# Patient Record
Sex: Female | Born: 1935 | Race: Black or African American | Hispanic: No | Smoking: Never smoker
Health system: Southern US, Community
[De-identification: ages and names within clinical notes are randomized; demographics above are authoritative.]

## PROBLEM LIST (undated history)

## (undated) DIAGNOSIS — M1990 Unspecified osteoarthritis, unspecified site: Secondary | ICD-10-CM

## (undated) DIAGNOSIS — H269 Unspecified cataract: Secondary | ICD-10-CM

## (undated) DIAGNOSIS — E119 Type 2 diabetes mellitus without complications: Secondary | ICD-10-CM

## (undated) HISTORY — DX: Unspecified osteoarthritis, unspecified site: M19.90

## (undated) HISTORY — DX: Type 2 diabetes mellitus without complications: E11.9

## (undated) HISTORY — DX: Unspecified cataract: H26.9

---

## 1998-11-22 ENCOUNTER — Emergency Department (HOSPITAL_COMMUNITY): Admission: EM | Admit: 1998-11-22 | Discharge: 1998-11-22 | Payer: Self-pay | Admitting: Emergency Medicine

## 1999-09-23 ENCOUNTER — Encounter: Admission: RE | Admit: 1999-09-23 | Discharge: 1999-09-23 | Payer: Self-pay | Admitting: Cardiology

## 1999-09-23 ENCOUNTER — Encounter: Payer: Self-pay | Admitting: Cardiology

## 2000-10-02 ENCOUNTER — Encounter: Admission: RE | Admit: 2000-10-02 | Discharge: 2000-10-02 | Payer: Self-pay | Admitting: Cardiology

## 2000-10-02 ENCOUNTER — Encounter: Payer: Self-pay | Admitting: Cardiology

## 2001-10-28 ENCOUNTER — Encounter: Payer: Self-pay | Admitting: Cardiology

## 2001-10-28 ENCOUNTER — Encounter: Admission: RE | Admit: 2001-10-28 | Discharge: 2001-10-28 | Payer: Self-pay | Admitting: Cardiology

## 2002-11-28 ENCOUNTER — Encounter: Payer: Self-pay | Admitting: Cardiology

## 2002-11-28 ENCOUNTER — Encounter: Admission: RE | Admit: 2002-11-28 | Discharge: 2002-11-28 | Payer: Self-pay | Admitting: Cardiology

## 2003-12-13 ENCOUNTER — Encounter: Admission: RE | Admit: 2003-12-13 | Discharge: 2003-12-13 | Payer: Self-pay | Admitting: Cardiology

## 2004-08-28 ENCOUNTER — Encounter: Admission: RE | Admit: 2004-08-28 | Discharge: 2004-08-28 | Payer: Self-pay | Admitting: Cardiology

## 2005-02-28 ENCOUNTER — Encounter: Admission: RE | Admit: 2005-02-28 | Discharge: 2005-02-28 | Payer: Self-pay | Admitting: Cardiology

## 2006-03-05 ENCOUNTER — Encounter: Admission: RE | Admit: 2006-03-05 | Discharge: 2006-03-05 | Payer: Self-pay | Admitting: Cardiology

## 2006-09-30 ENCOUNTER — Encounter: Admission: RE | Admit: 2006-09-30 | Discharge: 2006-12-29 | Payer: Self-pay | Admitting: Cardiology

## 2007-05-04 ENCOUNTER — Encounter: Admission: RE | Admit: 2007-05-04 | Discharge: 2007-05-04 | Payer: Self-pay | Admitting: Cardiology

## 2007-11-11 ENCOUNTER — Ambulatory Visit (HOSPITAL_COMMUNITY): Admission: RE | Admit: 2007-11-11 | Discharge: 2007-11-11 | Payer: Self-pay | Admitting: *Deleted

## 2007-11-11 ENCOUNTER — Ambulatory Visit: Payer: Self-pay | Admitting: Surgery

## 2007-11-11 ENCOUNTER — Encounter (INDEPENDENT_AMBULATORY_CARE_PROVIDER_SITE_OTHER): Payer: Self-pay | Admitting: Cardiology

## 2007-11-24 ENCOUNTER — Encounter: Admission: RE | Admit: 2007-11-24 | Discharge: 2007-11-24 | Payer: Self-pay | Admitting: Cardiology

## 2007-11-24 IMAGING — CR DG TIBIA/FIBULA 2V*R*
4 series · 4 of 4 positions shown · non-contrast
Comparison: None

CLINICAL DATA: Pain in the distal leg.

RIGHT TIBIA AND FIBULA - 2 VIEW

[t tib/fib ap right (1 of 2)]
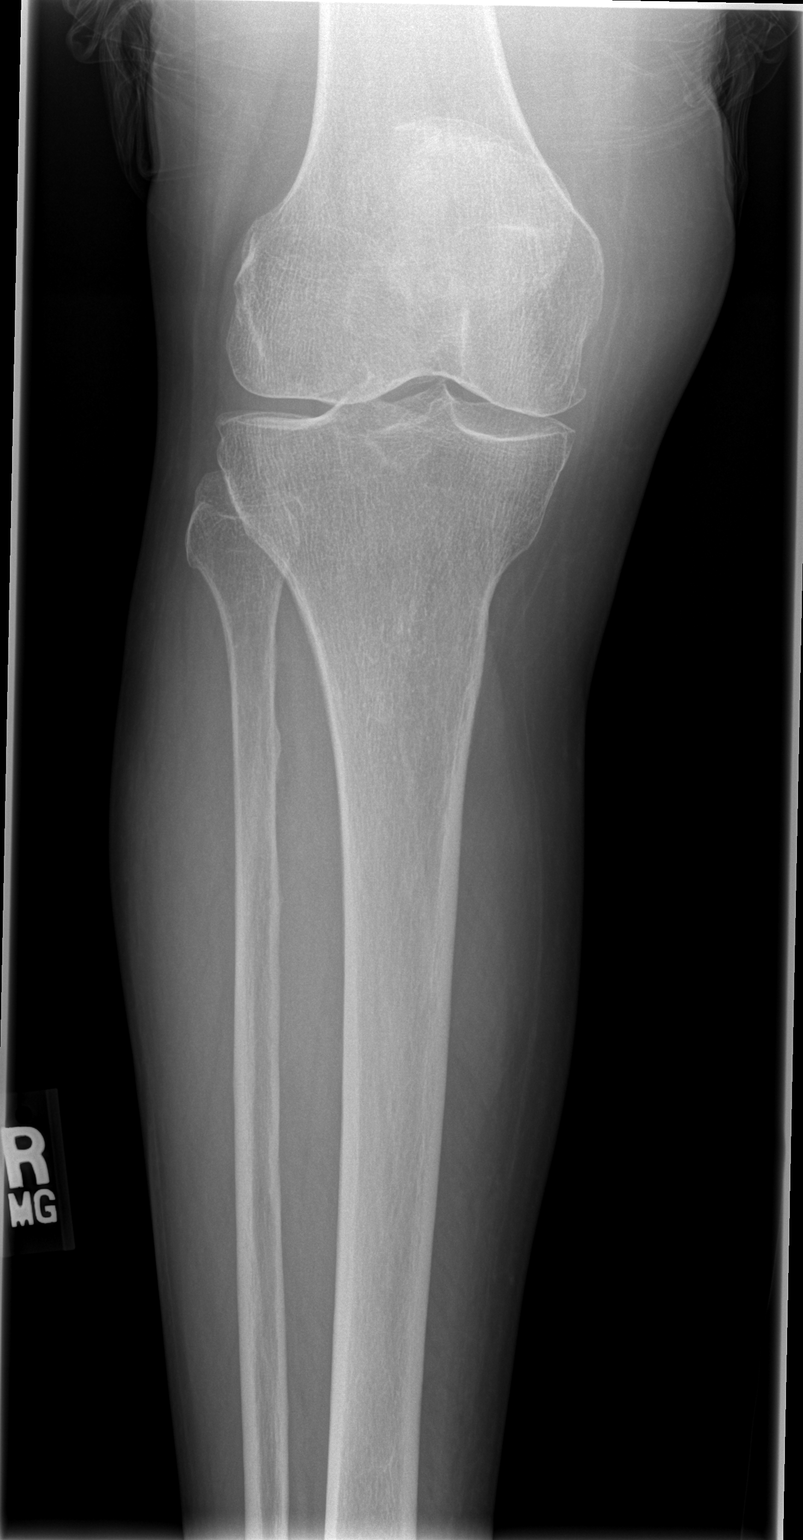

[t tib/fib ap right (2 of 2)]
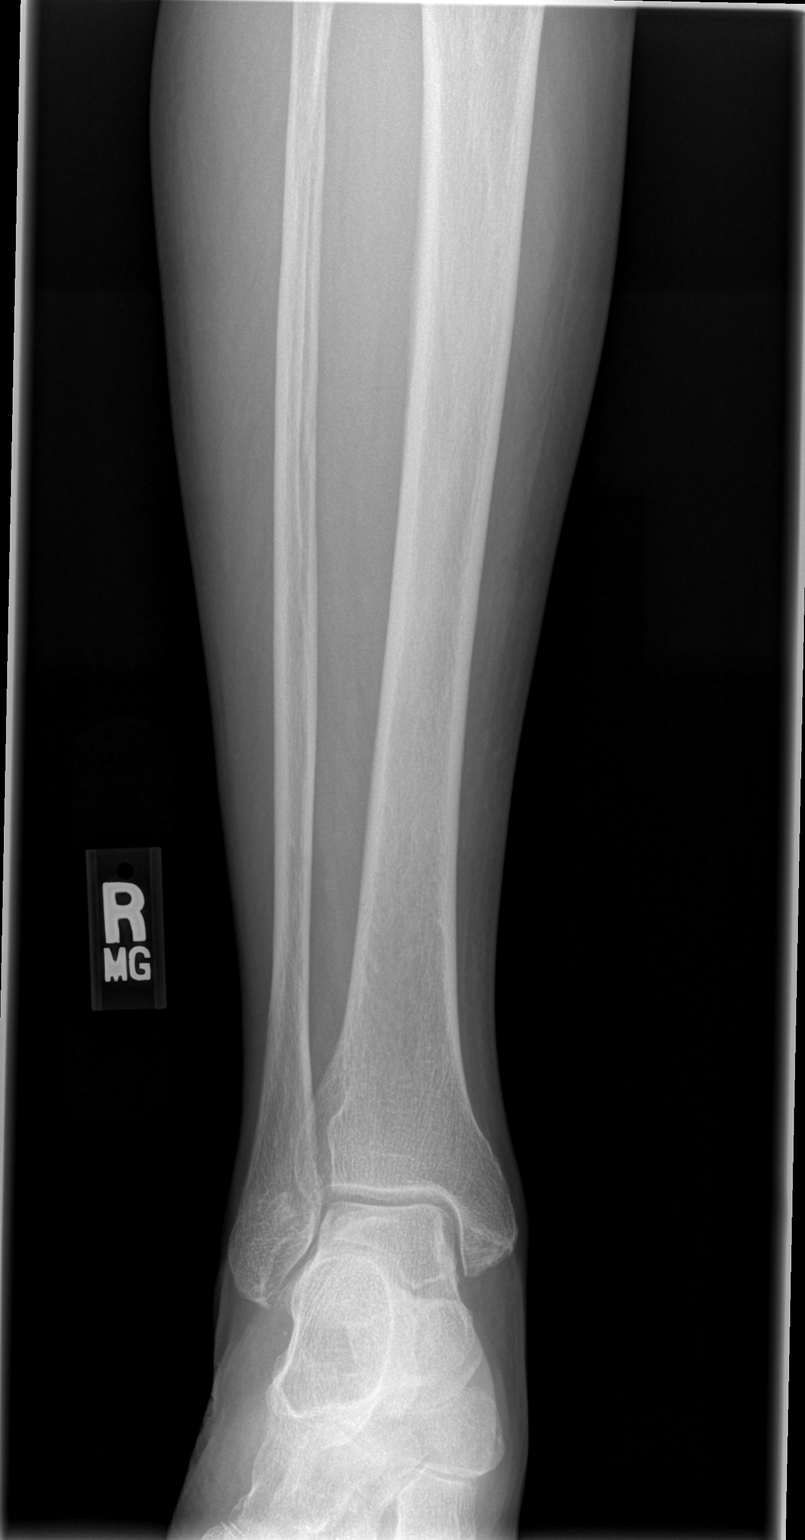

[t tib/fib lat right (1 of 2)]
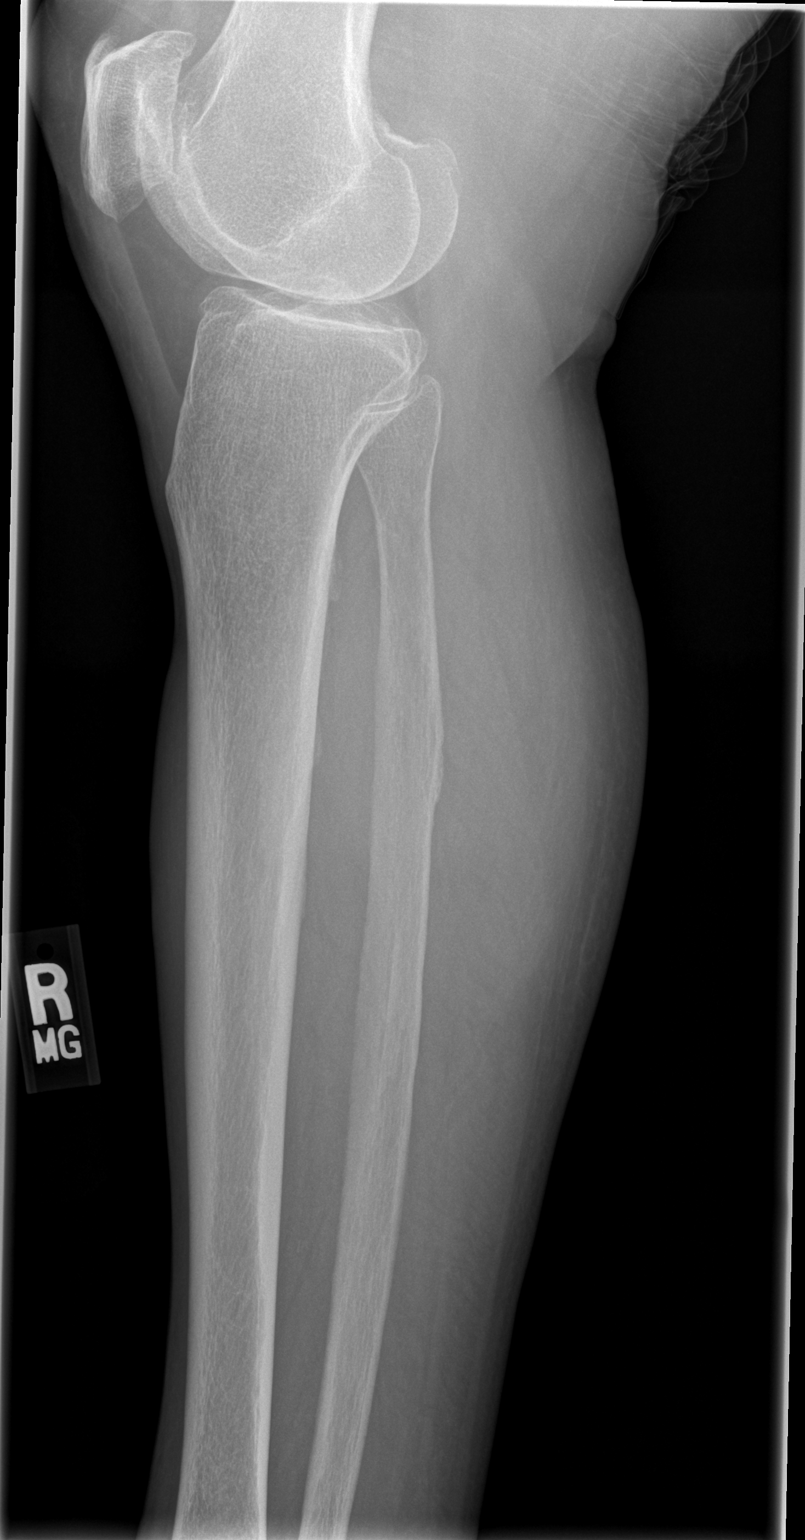

[t tib/fib lat right (2 of 2)]
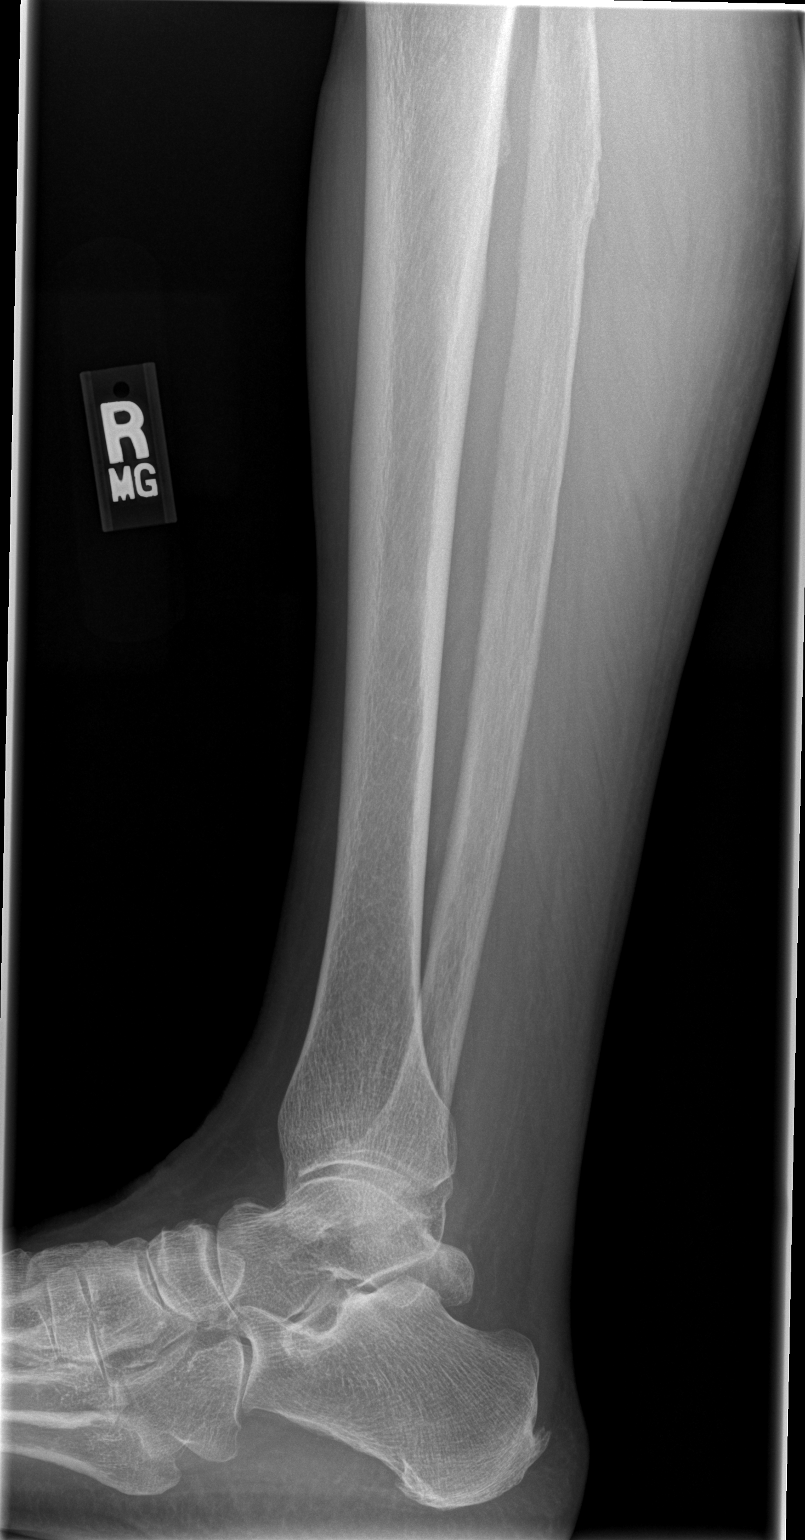

[4 of 4 positions shown; findings below may reference images not displayed]

FINDINGS: Two-view exam of the right tibia and fibula shows no
evidence for an acute fracture.  No worrisome lytic or sclerotic
osseous lesion.  Degenerative changes are seen in the knee.  The
patient has a very prominent posterior process of the talus, which
has been associated with posterior impingement.
IMPRESSION: No acute bony abnormality in the tibia or fibula.

## 2008-05-09 ENCOUNTER — Encounter: Admission: RE | Admit: 2008-05-09 | Discharge: 2008-05-09 | Payer: Self-pay | Admitting: Cardiology

## 2009-06-14 ENCOUNTER — Encounter: Admission: RE | Admit: 2009-06-14 | Discharge: 2009-06-14 | Payer: Self-pay | Admitting: Cardiology

## 2009-06-21 ENCOUNTER — Encounter: Admission: RE | Admit: 2009-06-21 | Discharge: 2009-06-21 | Payer: Self-pay | Admitting: Orthopedic Surgery

## 2010-07-02 ENCOUNTER — Other Ambulatory Visit: Payer: Self-pay | Admitting: Cardiology

## 2010-07-02 DIAGNOSIS — Z1231 Encounter for screening mammogram for malignant neoplasm of breast: Secondary | ICD-10-CM

## 2010-07-18 ENCOUNTER — Ambulatory Visit
Admission: RE | Admit: 2010-07-18 | Discharge: 2010-07-18 | Disposition: A | Discharge status: Home / Self Care | Payer: Medicare Other | Source: Ambulatory Visit | Attending: Cardiology | Admitting: Cardiology

## 2010-07-18 DIAGNOSIS — Z1231 Encounter for screening mammogram for malignant neoplasm of breast: Secondary | ICD-10-CM

## 2011-12-01 ENCOUNTER — Other Ambulatory Visit: Payer: Self-pay | Admitting: Cardiology

## 2011-12-01 DIAGNOSIS — Z1231 Encounter for screening mammogram for malignant neoplasm of breast: Secondary | ICD-10-CM

## 2011-12-10 ENCOUNTER — Other Ambulatory Visit: Payer: Self-pay | Admitting: Cardiology

## 2011-12-10 DIAGNOSIS — M259 Joint disorder, unspecified: Secondary | ICD-10-CM

## 2011-12-18 ENCOUNTER — Ambulatory Visit: Payer: Medicare Other

## 2012-01-05 ENCOUNTER — Other Ambulatory Visit: Payer: Medicare Other

## 2012-01-15 ENCOUNTER — Ambulatory Visit
Admission: RE | Admit: 2012-01-15 | Discharge: 2012-01-15 | Disposition: A | Discharge status: Home / Self Care | Payer: Medicare Other | Source: Ambulatory Visit | Attending: Cardiology | Admitting: Cardiology

## 2012-01-15 DIAGNOSIS — Z1231 Encounter for screening mammogram for malignant neoplasm of breast: Secondary | ICD-10-CM

## 2012-01-22 ENCOUNTER — Other Ambulatory Visit: Payer: Medicare Other

## 2012-02-05 ENCOUNTER — Ambulatory Visit
Admission: RE | Admit: 2012-02-05 | Discharge: 2012-02-05 | Disposition: A | Discharge status: Home / Self Care | Payer: Medicare Other | Source: Ambulatory Visit | Attending: Cardiology | Admitting: Cardiology

## 2012-02-05 DIAGNOSIS — M259 Joint disorder, unspecified: Secondary | ICD-10-CM

## 2014-10-24 ENCOUNTER — Other Ambulatory Visit: Payer: Self-pay | Admitting: Internal Medicine

## 2014-10-24 DIAGNOSIS — Z1231 Encounter for screening mammogram for malignant neoplasm of breast: Secondary | ICD-10-CM

## 2014-10-24 DIAGNOSIS — M8580 Other specified disorders of bone density and structure, unspecified site: Secondary | ICD-10-CM

## 2014-12-20 ENCOUNTER — Ambulatory Visit: Payer: Self-pay

## 2014-12-20 ENCOUNTER — Other Ambulatory Visit: Payer: Self-pay

## 2015-01-11 ENCOUNTER — Ambulatory Visit
Admission: RE | Admit: 2015-01-11 | Discharge: 2015-01-11 | Disposition: A | Discharge status: Home / Self Care | Payer: Medicare Other | Source: Ambulatory Visit | Attending: Internal Medicine | Admitting: Internal Medicine

## 2015-01-11 ENCOUNTER — Other Ambulatory Visit: Payer: Self-pay

## 2015-01-11 DIAGNOSIS — Z1231 Encounter for screening mammogram for malignant neoplasm of breast: Secondary | ICD-10-CM

## 2015-03-01 ENCOUNTER — Other Ambulatory Visit: Payer: Medicare Other

## 2015-04-03 ENCOUNTER — Other Ambulatory Visit: Payer: Medicare Other

## 2015-04-17 ENCOUNTER — Ambulatory Visit (INDEPENDENT_AMBULATORY_CARE_PROVIDER_SITE_OTHER): Payer: Medicare Other | Admitting: Physician Assistant

## 2015-04-17 VITALS — BP 128/80 | HR 89 | Temp 97.9°F | Resp 16 | Ht 61.0 in | Wt 174.0 lb

## 2015-04-17 DIAGNOSIS — E119 Type 2 diabetes mellitus without complications: Secondary | ICD-10-CM | POA: Insufficient documentation

## 2015-04-17 DIAGNOSIS — H6123 Impacted cerumen, bilateral: Secondary | ICD-10-CM

## 2015-04-17 NOTE — Progress Notes (Signed)
   Andrea Sweeney  MRN: [id number] DOB: 1928-10-11  Subjective:  Pt presents to clinic because her ears are clogged and she is having trouble hearing. She wears hearing aids but they have not been helping recently.  Some days are worse than others.  She has had a recent cold.  Patient Active Problem List   Diagnosis Date Noted  . DM type 2 (diabetes mellitus, type 2) (HCC) 04/17/2015    No current outpatient prescriptions on file prior to visit.   No current facility-administered medications on file prior to visit.    No Known Allergies  Review of Systems  HENT: Positive for hearing loss. Negative for ear pain.    Objective:  BP 128/80 mmHg  Pulse 89  Temp(Src) 97.9 F (36.6 C) (Oral)  Resp 16  Ht  (1.549 m)  Wt 174 lb (78.926 kg)  BMI 32.89 kg/m2  SpO2 89%  Physical Exam  Constitutional: She is oriented to person, place, and time and well-developed, well-nourished, and in no distress.  HENT:  Head: Normocephalic and atraumatic.  Right Ear: Hearing, tympanic membrane and external ear normal. A foreign body is present.  Left Ear: Hearing, tympanic membrane and external ear normal. A foreign body is present.  Nose: Nose normal.  Mouth/Throat: Uvula is midline, oropharynx is clear and moist and mucous membranes are normal.  Bilateral cerumen impaction - cleared with lavage -   Eyes: Conjunctivae are normal.  Neck: Normal range of motion.  Cardiovascular: Normal rate, regular rhythm and normal heart sounds.   No murmur heard. Pulmonary/Chest: Effort normal and breath sounds normal.  Neurological: She is alert and oriented to person, place, and time. Gait normal.  Skin: Skin is warm and dry.  Psychiatric: Mood, memory, affect and judgment normal.  Vitals reviewed.   Assessment and Plan :  Cerumen impaction, bilateral - Plan: Ear wax removal, Care order/instruction  Benny Lennert PA-C  [facility] 04/17/2015 11:55  AM

## 2015-05-03 ENCOUNTER — Inpatient Hospital Stay: Admission: RE | Admit: 2015-05-03 | Payer: Medicare Other | Source: Ambulatory Visit

## 2015-05-22 ENCOUNTER — Ambulatory Visit
Admission: RE | Admit: 2015-05-22 | Discharge: 2015-05-22 | Disposition: A | Discharge status: Home / Self Care | Payer: Medicare Other | Source: Ambulatory Visit | Attending: Internal Medicine | Admitting: Internal Medicine

## 2015-05-22 DIAGNOSIS — M8580 Other specified disorders of bone density and structure, unspecified site: Secondary | ICD-10-CM

## 2018-01-01 ENCOUNTER — Other Ambulatory Visit: Payer: Self-pay | Admitting: Internal Medicine

## 2018-01-01 ENCOUNTER — Ambulatory Visit
Admission: RE | Admit: 2018-01-01 | Discharge: 2018-01-01 | Disposition: A | Discharge status: Home / Self Care | Payer: Medicare Other | Source: Ambulatory Visit | Attending: Internal Medicine | Admitting: Internal Medicine

## 2018-01-01 DIAGNOSIS — R059 Cough, unspecified: Secondary | ICD-10-CM

## 2018-01-01 DIAGNOSIS — R05 Cough: Secondary | ICD-10-CM

## 2018-01-07 ENCOUNTER — Other Ambulatory Visit (HOSPITAL_COMMUNITY): Payer: Self-pay | Admitting: Internal Medicine

## 2018-01-07 DIAGNOSIS — I509 Heart failure, unspecified: Secondary | ICD-10-CM

## 2018-01-11 ENCOUNTER — Ambulatory Visit (HOSPITAL_COMMUNITY): Payer: Medicare Other | Attending: Cardiovascular Disease

## 2018-01-11 ENCOUNTER — Other Ambulatory Visit: Payer: Self-pay

## 2018-01-11 DIAGNOSIS — I509 Heart failure, unspecified: Secondary | ICD-10-CM | POA: Insufficient documentation

## 2018-08-26 ENCOUNTER — Other Ambulatory Visit: Payer: Self-pay

## 2018-08-26 ENCOUNTER — Ambulatory Visit (INDEPENDENT_AMBULATORY_CARE_PROVIDER_SITE_OTHER): Payer: Medicare Other | Admitting: Emergency Medicine

## 2018-08-26 ENCOUNTER — Encounter: Payer: Self-pay | Admitting: Emergency Medicine

## 2018-08-26 VITALS — BP 199/69 | HR 82 | Temp 99.0°F | Resp 16 | Wt 158.4 lb

## 2018-08-26 DIAGNOSIS — H6123 Impacted cerumen, bilateral: Secondary | ICD-10-CM

## 2018-08-26 NOTE — Progress Notes (Signed)
Andrea Sweeney 85 y.o.   Chief Complaint  Patient presents with  . Ear Problem    both per patient want them cleaned out    HISTORY OF PRESENT ILLNESS: This is a 85 y.o. female complaining of unable here secondary to wax buildup in both ears.  No other complaints or medical concerns.  HPI   Prior to Admission medications   Medication Sig Start Date End Date Taking? Authorizing Provider  atorvastatin (LIPITOR) 20 MG tablet Take 20 mg by mouth daily. 03/29/15  Yes [provider]  pioglitazone (ACTOS) 15 MG tablet Take 15 mg by mouth daily. 03/29/15  Yes [provider]    No Known Allergies  Patient Active Problem List   Diagnosis Date Noted  . DM type 2 (diabetes mellitus, type 2) (HCC) 04/17/2015    Past Medical History:  Diagnosis Date  . Arthritis   . Cataract   . Diabetes mellitus without complication (HCC)     History reviewed. No pertinent surgical history.  Social History   Socioeconomic History  . Marital status: Widowed    Spouse name: Not on file  . Number of children: Not on file  . Years of education: Not on file  . Highest education level: Not on file  Occupational History  . Not on file  Social Needs  . Financial resource strain: Not on file  . Food insecurity    Worry: Not on file    Inability: Not on file  . Transportation needs    Medical: Not on file    Non-medical: Not on file  Tobacco Use  . Smoking status: Never Smoker  . Smokeless tobacco: Never Used  Substance and Sexual Activity  . Alcohol use: No    Alcohol/week: 0.0 standard drinks  . Drug use: No  . Sexual activity: Not on file  Lifestyle  . Physical activity    Days per week: Not on file    Minutes per session: Not on file  . Stress: Not on file  Relationships  . Social Musicianconnections    Talks on phone: Not on file    Gets together: Not on file    Attends religious service: Not on file    Active member of club or organization: Not on file    Attends  meetings of clubs or organizations: Not on file    Relationship status: Not on file  . Intimate partner violence    Fear of current or ex partner: Not on file    Emotionally abused: Not on file    Physically abused: Not on file    Forced sexual activity: Not on file  Other Topics Concern  . Not on file  Social History Narrative  . Not on file    Family History  Problem Relation Age of Onset  . Cancer Mother   . Cancer Father   . Diabetes Sister   . Cancer Brother   . Diabetes Brother      Review of Systems  Constitutional: Negative.  Negative for fever.  HENT: Positive for hearing loss.   Respiratory: Negative for cough and shortness of breath.   Cardiovascular: Negative for chest pain and palpitations.  Gastrointestinal: Negative for abdominal pain, nausea and vomiting.  Neurological: Negative.   All other systems reviewed and are negative.  Vitals:   08/26/18 1442  BP: (!) 199/69  Pulse: 82  Resp: 16  Temp: 99 F (37.2 C)  SpO2: 93%     Physical Exam Vitals signs  reviewed.  Constitutional:      Appearance: Normal appearance.  HENT:     Head: Normocephalic and atraumatic.     Right Ear: There is impacted cerumen.     Left Ear: There is impacted cerumen.  Neck:     Musculoskeletal: Normal range of motion.  Cardiovascular:     Rate and Rhythm: Normal rate and regular rhythm.     Heart sounds: Normal heart sounds.  Pulmonary:     Effort: Pulmonary effort is normal.     Breath sounds: Normal breath sounds.  Neurological:     General: No focal deficit present.     Mental Status: She is alert and oriented to person, place, and time.  Psychiatric:        Mood and Affect: Mood normal.        Behavior: Behavior normal.    Ceruminosis is noted.  Wax is removed by syringing and manual debridement. Instructions for home care to prevent wax buildup are given.   ASSESSMENT & PLAN: [name] was seen today for ear problem.  Diagnoses and all orders for this  visit:  Hearing loss due to cerumen impaction, bilateral -     Ear wax removal    Patient Instructions  Earwax Buildup, Adult The ears produce a substance called earwax that helps keep bacteria out of the ear and protects the skin in the ear canal. Occasionally, earwax can build up in the ear and cause discomfort or hearing loss. What increases the risk? This condition is more likely to develop in people who:  Are female.  Are elderly.  Naturally produce more earwax.  Clean their ears often with cotton swabs.  Use earplugs often.  Use in-ear headphones often.  Wear hearing aids.  Have narrow ear canals.  Have earwax that is overly thick or sticky.  Have eczema.  Are dehydrated.  Have excess hair in the ear canal. What are the signs or symptoms? Symptoms of this condition include:  Reduced or muffled hearing.  A feeling of fullness in the ear or feeling that the ear is plugged.  Fluid coming from the ear.  Ear pain.  Ear itch.  Ringing in the ear.  Coughing.  An obvious piece of earwax that can be seen inside the ear canal. How is this diagnosed? This condition may be diagnosed based on:  Your symptoms.  Your medical history.  An ear exam. During the exam, your health care provider will look into your ear with an instrument called an otoscope. You may have tests, including a hearing test. How is this treated? This condition may be treated by:  Using ear drops to soften the earwax.  Having the earwax removed by a health care provider. The health care provider may: ? Flush the ear with water. ? Use an instrument that has a loop on the end (curette). ? Use a suction device.  Surgery to remove the wax buildup. This may be done in severe cases. Follow these instructions at home:   Take over-the-counter and prescription medicines only as told by your health care provider.  Do not put any objects, including cotton swabs, into your ear. You can  clean the opening of your ear canal with a washcloth or facial tissue.  Follow instructions from your health care provider about cleaning your ears. Do not over-clean your ears.  Drink enough fluid to keep your urine clear or pale yellow. This will help to thin the earwax.  Keep all follow-up visits as  told by your health care provider. If earwax builds up in your ears often or if you use hearing aids, consider seeing your health care provider for routine, preventive ear cleanings. Ask your health care provider how often you should schedule your cleanings.  If you have hearing aids, clean them according to instructions from the manufacturer and your health care provider. Contact a health care provider if:  You have ear pain.  You develop a fever.  You have blood, pus, or other fluid coming from your ear.  You have hearing loss.  You have ringing in your ears that does not go away.  Your symptoms do not improve with treatment.  You feel like the room is spinning (vertigo). Summary  Earwax can build up in the ear and cause discomfort or hearing loss.  The most common symptoms of this condition include reduced or muffled hearing and a feeling of fullness in the ear or feeling that the ear is plugged.  This condition may be diagnosed based on your symptoms, your medical history, and an ear exam.  This condition may be treated by using ear drops to soften the earwax or by having the earwax removed by a health care provider.  Do not put any objects, including cotton swabs, into your ear. You can clean the opening of your ear canal with a washcloth or facial tissue. This information is not intended to replace advice given to you by your health care provider. Make sure you discuss any questions you have with your health care provider. Document Released: 03/27/2004 Document Revised: 01/29/2017 Document Reviewed: 04/30/2016 Elsevier Interactive Patient Education  2019 Elsevier Inc.       Agustina Caroli, MD [facility]

## 2018-08-26 NOTE — Patient Instructions (Signed)
Earwax Buildup, Adult  The ears produce a substance called earwax that helps keep bacteria out of the ear and protects the skin in the ear canal. Occasionally, earwax can build up in the ear and cause discomfort or hearing loss.  What increases the risk?  This condition is more likely to develop in people who:  · Are female.  · Are elderly.  · Naturally produce more earwax.  · Clean their ears often with cotton swabs.  · Use earplugs often.  · Use in-ear headphones often.  · Wear hearing aids.  · Have narrow ear canals.  · Have earwax that is overly thick or sticky.  · Have eczema.  · Are dehydrated.  · Have excess hair in the ear canal.  What are the signs or symptoms?  Symptoms of this condition include:  · Reduced or muffled hearing.  · A feeling of fullness in the ear or feeling that the ear is plugged.  · Fluid coming from the ear.  · Ear pain.  · Ear itch.  · Ringing in the ear.  · Coughing.  · An obvious piece of earwax that can be seen inside the ear canal.  How is this diagnosed?  This condition may be diagnosed based on:  · Your symptoms.  · Your medical history.  · An ear exam. During the exam, your health care provider will look into your ear with an instrument called an otoscope.  You may have tests, including a hearing test.  How is this treated?  This condition may be treated by:  · Using ear drops to soften the earwax.  · Having the earwax removed by a health care provider. The health care provider may:  ? Flush the ear with water.  ? Use an instrument that has a loop on the end (curette).  ? Use a suction device.  · Surgery to remove the wax buildup. This may be done in severe cases.  Follow these instructions at home:    · Take over-the-counter and prescription medicines only as told by your health care provider.  · Do not put any objects, including cotton swabs, into your ear. You can clean the opening of your ear canal with a washcloth or facial tissue.  · Follow instructions from your health care  provider about cleaning your ears. Do not over-clean your ears.  · Drink enough fluid to keep your urine clear or pale yellow. This will help to thin the earwax.  · Keep all follow-up visits as told by your health care provider. If earwax builds up in your ears often or if you use hearing aids, consider seeing your health care provider for routine, preventive ear cleanings. Ask your health care provider how often you should schedule your cleanings.  · If you have hearing aids, clean them according to instructions from the manufacturer and your health care provider.  Contact a health care provider if:  · You have ear pain.  · You develop a fever.  · You have blood, pus, or other fluid coming from your ear.  · You have hearing loss.  · You have ringing in your ears that does not go away.  · Your symptoms do not improve with treatment.  · You feel like the room is spinning (vertigo).  Summary  · Earwax can build up in the ear and cause discomfort or hearing loss.  · The most common symptoms of this condition include reduced or muffled hearing and a feeling of   fullness in the ear or feeling that the ear is plugged.  · This condition may be diagnosed based on your symptoms, your medical history, and an ear exam.  · This condition may be treated by using ear drops to soften the earwax or by having the earwax removed by a health care provider.  · Do not put any objects, including cotton swabs, into your ear. You can clean the opening of your ear canal with a washcloth or facial tissue.  This information is not intended to replace advice given to you by your health care provider. Make sure you discuss any questions you have with your health care provider.  Document Released: 03/27/2004 Document Revised: 01/29/2017 Document Reviewed: 04/30/2016  Elsevier Interactive Patient Education © 2019 Elsevier Inc.

## 2019-06-10 ENCOUNTER — Ambulatory Visit (INDEPENDENT_AMBULATORY_CARE_PROVIDER_SITE_OTHER): Payer: Medicare Other | Admitting: Otolaryngology

## 2019-06-10 ENCOUNTER — Other Ambulatory Visit: Payer: Self-pay

## 2019-06-10 DIAGNOSIS — H6123 Impacted cerumen, bilateral: Secondary | ICD-10-CM | POA: Diagnosis not present

## 2019-06-10 NOTE — Progress Notes (Signed)
HPI: Andrea Sweeney is a 86 y.o. female who presents for evaluation of wax buildup in both ears.  She wears bilateral hearing aids.  Last visit was 09/24/2017.Marland Kitchen  Past Medical History:  Diagnosis Date  . Arthritis   . Cataract   . Diabetes mellitus without complication (HCC)    No past surgical history on file. Social History   Socioeconomic History  . Marital status: Widowed    Spouse name: Not on file  . Number of children: Not on file  . Years of education: Not on file  . Highest education level: Not on file  Occupational History  . Not on file  Tobacco Use  . Smoking status: Never Smoker  . Smokeless tobacco: Never Used  Substance and Sexual Activity  . Alcohol use: No    Alcohol/week: 0.0 standard drinks  . Drug use: No  . Sexual activity: Not on file  Other Topics Concern  . Not on file  Social History Narrative  . Not on file   Social Determinants of Health   Financial Resource Strain:   . Difficulty of Paying Living Expenses:   Food Insecurity:   . Worried About Programme researcher, broadcasting/film/video in the Last Year:   . Barista in the Last Year:   Transportation Needs:   . Freight forwarder (Medical):   Marland Kitchen Lack of Transportation (Non-Medical):   Physical Activity:   . Days of Exercise per Week:   . Minutes of Exercise per Session:   Stress:   . Feeling of Stress :   Social Connections:   . Frequency of Communication with Friends and Family:   . Frequency of Social Gatherings with Friends and Family:   . Attends Religious Services:   . Active Member of Clubs or Organizations:   . Attends Banker Meetings:   Marland Kitchen Marital Status:    Family History  Problem Relation Age of Onset  . Cancer Mother   . Cancer Father   . Diabetes Sister   . Cancer Brother   . Diabetes Brother    No Known Allergies Prior to Admission medications   Medication Sig Start Date End Date Taking? Authorizing Provider  atorvastatin (LIPITOR) 20 MG tablet Take 20 mg by  mouth daily. 03/29/15   [provider]  pioglitazone (ACTOS) 15 MG tablet Take 15 mg by mouth daily. 03/29/15   [provider]     Positive ROS: Otherwise negative  All other systems have been reviewed and were otherwise negative with the exception of those mentioned in the HPI and as above.  Physical Exam: Constitutional: Alert, well-appearing, no acute distress Ears: External ears without lesions or tenderness. Ear canals both ear canals are obstructed with cerumen which was removed with forceps and curettes.  TMs are clear bilaterally.  Hearing is much better.. Nasal: External nose without lesions. Clear nasal passages Oral: Oropharynx clear. Neck: No palpable adenopathy or masses Respiratory: Breathing comfortably  Skin: No facial/neck lesions or rash noted.  Cerumen impaction removal  Date/Time: 06/10/2019 4:12 PM Performed by: Drema Halon, MD Authorized by: Drema Halon, MD   Consent:    Consent obtained:  Verbal   Consent given by:  Patient   Risks discussed:  Pain and bleeding Procedure details:    Location:  L ear and R ear   Procedure type: curette and forceps   Post-procedure details:    Inspection:  TM intact and canal normal   Hearing quality:  Improved  Patient tolerance of procedure:  Tolerated well, no immediate complications Comments:     TMs are clear bilaterally.    Assessment: Bilateral cerumen impactions in patient who wears hearing aids  Plan: She will follow-up as needed.  Radene Journey, MD

## 2019-10-06 ENCOUNTER — Other Ambulatory Visit: Payer: Self-pay | Admitting: Internal Medicine

## 2019-10-06 DIAGNOSIS — M810 Age-related osteoporosis without current pathological fracture: Secondary | ICD-10-CM

## 2020-01-06 ENCOUNTER — Other Ambulatory Visit: Payer: Self-pay

## 2020-01-06 ENCOUNTER — Encounter (INDEPENDENT_AMBULATORY_CARE_PROVIDER_SITE_OTHER): Payer: Self-pay | Admitting: Otolaryngology

## 2020-01-06 ENCOUNTER — Ambulatory Visit (INDEPENDENT_AMBULATORY_CARE_PROVIDER_SITE_OTHER): Payer: Medicare Other | Admitting: Otolaryngology

## 2020-01-06 VITALS — Temp 97.3°F

## 2020-01-06 DIAGNOSIS — H6123 Impacted cerumen, bilateral: Secondary | ICD-10-CM | POA: Diagnosis not present

## 2020-01-06 DIAGNOSIS — H903 Sensorineural hearing loss, bilateral: Secondary | ICD-10-CM | POA: Diagnosis not present

## 2020-01-06 NOTE — Progress Notes (Signed)
HPI: Andrea Sweeney is a 86 y.o. female who presents for evaluation of wax buildup in her ears.  She wears bilateral hearing aids.  She was last cleaned 6 months ago..  Past Medical History:  Diagnosis Date  . Arthritis   . Cataract   . Diabetes mellitus without complication (HCC)    No past surgical history on file. Social History   Socioeconomic History  . Marital status: Widowed    Spouse name: Not on file  . Number of children: Not on file  . Years of education: Not on file  . Highest education level: Not on file  Occupational History  . Not on file  Tobacco Use  . Smoking status: Never Smoker  . Smokeless tobacco: Never Used  Substance and Sexual Activity  . Alcohol use: No    Alcohol/week: 0.0 standard drinks  . Drug use: No  . Sexual activity: Not on file  Other Topics Concern  . Not on file  Social History Narrative  . Not on file   Social Determinants of Health   Financial Resource Strain:   . Difficulty of Paying Living Expenses: Not on file  Food Insecurity:   . Worried About Programme researcher, broadcasting/film/video in the Last Year: Not on file  . Ran Out of Food in the Last Year: Not on file  Transportation Needs:   . Lack of Transportation (Medical): Not on file  . Lack of Transportation (Non-Medical): Not on file  Physical Activity:   . Days of Exercise per Week: Not on file  . Minutes of Exercise per Session: Not on file  Stress:   . Feeling of Stress : Not on file  Social Connections:   . Frequency of Communication with Friends and Family: Not on file  . Frequency of Social Gatherings with Friends and Family: Not on file  . Attends Religious Services: Not on file  . Active Member of Clubs or Organizations: Not on file  . Attends Banker Meetings: Not on file  . Marital Status: Not on file   Family History  Problem Relation Age of Onset  . Cancer Mother   . Cancer Father   . Diabetes Sister   . Cancer Brother   . Diabetes Brother    No Known  Allergies Prior to Admission medications   Medication Sig Start Date End Date Taking? Authorizing Provider  atorvastatin (LIPITOR) 20 MG tablet Take 20 mg by mouth daily. 03/29/15  Yes [provider]  pioglitazone (ACTOS) 15 MG tablet Take 15 mg by mouth daily. 03/29/15  Yes [provider]     Positive ROS: Otherwise negative  All other systems have been reviewed and were otherwise negative with the exception of those mentioned in the HPI and as above.  Physical Exam: Constitutional: Alert, well-appearing, no acute distress Ears: External ears without lesions or tenderness. Ear canals she had mild amount of wax buildup in both ear canals right side worse than left.  This was cleaned with forceps and curettes.  TMs were clear bilaterally.. Nasal: External nose without lesions. Clear nasal passages Oral: Oropharynx clear. Neck: No palpable adenopathy or masses Respiratory: Breathing comfortably  Skin: No facial/neck lesions or rash noted.  Cerumen impaction removal  Date/Time: 01/06/2020 3:12 PM Performed by: Drema Halon, MD Authorized by: Drema Halon, MD   Consent:    Consent obtained:  Verbal   Consent given by:  Patient   Risks discussed:  Pain and bleeding Procedure details:  Location:  L ear and R ear   Procedure type: curette and forceps   Post-procedure details:    Inspection:  TM intact and canal normal   Hearing quality:  Improved   Patient tolerance of procedure:  Tolerated well, no immediate complications Comments:     TMs are clear bilaterally.    Assessment: Bilateral cerumen impactions in patient with hearing aids.  Plan: She will follow-up as needed.  Narda Bonds, MD

## 2020-01-10 ENCOUNTER — Encounter (INDEPENDENT_AMBULATORY_CARE_PROVIDER_SITE_OTHER): Payer: Medicare Other | Admitting: Ophthalmology

## 2020-01-23 ENCOUNTER — Encounter (INDEPENDENT_AMBULATORY_CARE_PROVIDER_SITE_OTHER): Payer: Medicare Other | Admitting: Ophthalmology

## 2020-02-01 ENCOUNTER — Encounter (INDEPENDENT_AMBULATORY_CARE_PROVIDER_SITE_OTHER): Payer: Medicare Other | Admitting: Ophthalmology

## 2020-02-14 ENCOUNTER — Other Ambulatory Visit: Payer: Self-pay

## 2020-02-14 ENCOUNTER — Ambulatory Visit
Admission: RE | Admit: 2020-02-14 | Discharge: 2020-02-14 | Disposition: A | Discharge status: Home / Self Care | Payer: Medicare Other | Source: Ambulatory Visit | Attending: Internal Medicine | Admitting: Internal Medicine

## 2020-02-14 DIAGNOSIS — M810 Age-related osteoporosis without current pathological fracture: Secondary | ICD-10-CM

## 2020-03-05 ENCOUNTER — Encounter (INDEPENDENT_AMBULATORY_CARE_PROVIDER_SITE_OTHER): Payer: Medicare Other | Admitting: Ophthalmology

## 2020-03-27 ENCOUNTER — Encounter (INDEPENDENT_AMBULATORY_CARE_PROVIDER_SITE_OTHER): Payer: Medicare Other | Admitting: Ophthalmology

## 2020-03-27 ENCOUNTER — Other Ambulatory Visit: Payer: Self-pay

## 2020-03-27 DIAGNOSIS — E113313 Type 2 diabetes mellitus with moderate nonproliferative diabetic retinopathy with macular edema, bilateral: Secondary | ICD-10-CM | POA: Diagnosis not present

## 2020-03-27 DIAGNOSIS — H43813 Vitreous degeneration, bilateral: Secondary | ICD-10-CM

## 2020-03-27 DIAGNOSIS — I10 Essential (primary) hypertension: Secondary | ICD-10-CM

## 2020-03-27 DIAGNOSIS — H353131 Nonexudative age-related macular degeneration, bilateral, early dry stage: Secondary | ICD-10-CM

## 2020-03-27 DIAGNOSIS — H35033 Hypertensive retinopathy, bilateral: Secondary | ICD-10-CM | POA: Diagnosis not present

## 2020-04-02 ENCOUNTER — Encounter (INDEPENDENT_AMBULATORY_CARE_PROVIDER_SITE_OTHER): Payer: Medicare Other | Admitting: Ophthalmology

## 2020-04-02 ENCOUNTER — Other Ambulatory Visit: Payer: Self-pay

## 2020-04-02 DIAGNOSIS — E113313 Type 2 diabetes mellitus with moderate nonproliferative diabetic retinopathy with macular edema, bilateral: Secondary | ICD-10-CM

## 2020-04-30 ENCOUNTER — Other Ambulatory Visit: Payer: Self-pay

## 2020-04-30 ENCOUNTER — Encounter (INDEPENDENT_AMBULATORY_CARE_PROVIDER_SITE_OTHER): Payer: Medicare Other | Admitting: Ophthalmology

## 2020-04-30 DIAGNOSIS — E113211 Type 2 diabetes mellitus with mild nonproliferative diabetic retinopathy with macular edema, right eye: Secondary | ICD-10-CM | POA: Diagnosis not present

## 2020-04-30 DIAGNOSIS — E113312 Type 2 diabetes mellitus with moderate nonproliferative diabetic retinopathy with macular edema, left eye: Secondary | ICD-10-CM

## 2020-04-30 DIAGNOSIS — H35033 Hypertensive retinopathy, bilateral: Secondary | ICD-10-CM | POA: Diagnosis not present

## 2020-04-30 DIAGNOSIS — I10 Essential (primary) hypertension: Secondary | ICD-10-CM

## 2020-04-30 DIAGNOSIS — H43813 Vitreous degeneration, bilateral: Secondary | ICD-10-CM

## 2020-05-23 DIAGNOSIS — E785 Hyperlipidemia, unspecified: Secondary | ICD-10-CM | POA: Diagnosis not present

## 2020-05-23 DIAGNOSIS — I10 Essential (primary) hypertension: Secondary | ICD-10-CM | POA: Diagnosis not present

## 2020-05-23 DIAGNOSIS — M810 Age-related osteoporosis without current pathological fracture: Secondary | ICD-10-CM | POA: Diagnosis not present

## 2020-05-23 DIAGNOSIS — E1122 Type 2 diabetes mellitus with diabetic chronic kidney disease: Secondary | ICD-10-CM | POA: Diagnosis not present

## 2020-05-23 DIAGNOSIS — I130 Hypertensive heart and chronic kidney disease with heart failure and stage 1 through stage 4 chronic kidney disease, or unspecified chronic kidney disease: Secondary | ICD-10-CM | POA: Diagnosis not present

## 2020-05-23 DIAGNOSIS — I509 Heart failure, unspecified: Secondary | ICD-10-CM | POA: Diagnosis not present

## 2020-05-23 DIAGNOSIS — N183 Chronic kidney disease, stage 3 unspecified: Secondary | ICD-10-CM | POA: Diagnosis not present

## 2020-05-28 ENCOUNTER — Other Ambulatory Visit: Payer: Self-pay

## 2020-05-28 ENCOUNTER — Encounter (INDEPENDENT_AMBULATORY_CARE_PROVIDER_SITE_OTHER): Payer: Medicare Other | Admitting: Ophthalmology

## 2020-05-28 DIAGNOSIS — E113313 Type 2 diabetes mellitus with moderate nonproliferative diabetic retinopathy with macular edema, bilateral: Secondary | ICD-10-CM | POA: Diagnosis not present

## 2020-05-28 DIAGNOSIS — H43813 Vitreous degeneration, bilateral: Secondary | ICD-10-CM

## 2020-05-28 DIAGNOSIS — I10 Essential (primary) hypertension: Secondary | ICD-10-CM

## 2020-05-28 DIAGNOSIS — H35033 Hypertensive retinopathy, bilateral: Secondary | ICD-10-CM

## 2020-06-25 ENCOUNTER — Encounter (INDEPENDENT_AMBULATORY_CARE_PROVIDER_SITE_OTHER): Payer: Medicare Other | Admitting: Ophthalmology

## 2020-06-25 ENCOUNTER — Other Ambulatory Visit: Payer: Self-pay

## 2020-06-25 DIAGNOSIS — H35033 Hypertensive retinopathy, bilateral: Secondary | ICD-10-CM | POA: Diagnosis not present

## 2020-06-25 DIAGNOSIS — E113391 Type 2 diabetes mellitus with moderate nonproliferative diabetic retinopathy without macular edema, right eye: Secondary | ICD-10-CM

## 2020-06-25 DIAGNOSIS — I10 Essential (primary) hypertension: Secondary | ICD-10-CM

## 2020-06-25 DIAGNOSIS — H43813 Vitreous degeneration, bilateral: Secondary | ICD-10-CM | POA: Diagnosis not present

## 2020-06-25 DIAGNOSIS — E113312 Type 2 diabetes mellitus with moderate nonproliferative diabetic retinopathy with macular edema, left eye: Secondary | ICD-10-CM

## 2020-07-23 ENCOUNTER — Encounter (INDEPENDENT_AMBULATORY_CARE_PROVIDER_SITE_OTHER): Payer: Medicare Other | Admitting: Ophthalmology

## 2020-07-24 ENCOUNTER — Other Ambulatory Visit: Payer: Self-pay

## 2020-07-24 ENCOUNTER — Encounter (INDEPENDENT_AMBULATORY_CARE_PROVIDER_SITE_OTHER): Payer: Medicare Other | Admitting: Ophthalmology

## 2020-07-24 DIAGNOSIS — I10 Essential (primary) hypertension: Secondary | ICD-10-CM | POA: Diagnosis not present

## 2020-07-24 DIAGNOSIS — H43813 Vitreous degeneration, bilateral: Secondary | ICD-10-CM

## 2020-07-24 DIAGNOSIS — E113312 Type 2 diabetes mellitus with moderate nonproliferative diabetic retinopathy with macular edema, left eye: Secondary | ICD-10-CM

## 2020-07-24 DIAGNOSIS — H35033 Hypertensive retinopathy, bilateral: Secondary | ICD-10-CM

## 2020-07-24 DIAGNOSIS — E113391 Type 2 diabetes mellitus with moderate nonproliferative diabetic retinopathy without macular edema, right eye: Secondary | ICD-10-CM

## 2020-08-23 ENCOUNTER — Other Ambulatory Visit: Payer: Self-pay

## 2020-08-23 ENCOUNTER — Encounter (INDEPENDENT_AMBULATORY_CARE_PROVIDER_SITE_OTHER): Payer: Medicare Other | Admitting: Ophthalmology

## 2020-08-23 DIAGNOSIS — E113312 Type 2 diabetes mellitus with moderate nonproliferative diabetic retinopathy with macular edema, left eye: Secondary | ICD-10-CM | POA: Diagnosis not present

## 2020-08-23 DIAGNOSIS — H35033 Hypertensive retinopathy, bilateral: Secondary | ICD-10-CM

## 2020-08-23 DIAGNOSIS — E113391 Type 2 diabetes mellitus with moderate nonproliferative diabetic retinopathy without macular edema, right eye: Secondary | ICD-10-CM

## 2020-08-23 DIAGNOSIS — I10 Essential (primary) hypertension: Secondary | ICD-10-CM

## 2020-08-23 DIAGNOSIS — H43813 Vitreous degeneration, bilateral: Secondary | ICD-10-CM

## 2020-09-20 ENCOUNTER — Encounter (INDEPENDENT_AMBULATORY_CARE_PROVIDER_SITE_OTHER): Payer: Medicare Other | Admitting: Ophthalmology

## 2020-09-26 DIAGNOSIS — I509 Heart failure, unspecified: Secondary | ICD-10-CM | POA: Diagnosis not present

## 2020-09-26 DIAGNOSIS — E7800 Pure hypercholesterolemia, unspecified: Secondary | ICD-10-CM | POA: Diagnosis not present

## 2020-09-26 DIAGNOSIS — I130 Hypertensive heart and chronic kidney disease with heart failure and stage 1 through stage 4 chronic kidney disease, or unspecified chronic kidney disease: Secondary | ICD-10-CM | POA: Diagnosis not present

## 2020-09-26 DIAGNOSIS — N183 Chronic kidney disease, stage 3 unspecified: Secondary | ICD-10-CM | POA: Diagnosis not present

## 2020-09-26 DIAGNOSIS — I10 Essential (primary) hypertension: Secondary | ICD-10-CM | POA: Diagnosis not present

## 2020-09-26 DIAGNOSIS — E1122 Type 2 diabetes mellitus with diabetic chronic kidney disease: Secondary | ICD-10-CM | POA: Diagnosis not present

## 2020-09-26 DIAGNOSIS — M810 Age-related osteoporosis without current pathological fracture: Secondary | ICD-10-CM | POA: Diagnosis not present

## 2020-09-26 DIAGNOSIS — E785 Hyperlipidemia, unspecified: Secondary | ICD-10-CM | POA: Diagnosis not present

## 2020-09-27 ENCOUNTER — Encounter (INDEPENDENT_AMBULATORY_CARE_PROVIDER_SITE_OTHER): Payer: Medicare Other | Admitting: Ophthalmology

## 2020-09-27 ENCOUNTER — Other Ambulatory Visit: Payer: Self-pay

## 2020-09-27 DIAGNOSIS — H35033 Hypertensive retinopathy, bilateral: Secondary | ICD-10-CM

## 2020-09-27 DIAGNOSIS — E113391 Type 2 diabetes mellitus with moderate nonproliferative diabetic retinopathy without macular edema, right eye: Secondary | ICD-10-CM

## 2020-09-27 DIAGNOSIS — I10 Essential (primary) hypertension: Secondary | ICD-10-CM

## 2020-09-27 DIAGNOSIS — E113312 Type 2 diabetes mellitus with moderate nonproliferative diabetic retinopathy with macular edema, left eye: Secondary | ICD-10-CM

## 2020-09-27 DIAGNOSIS — H43813 Vitreous degeneration, bilateral: Secondary | ICD-10-CM

## 2020-10-08 DIAGNOSIS — E7800 Pure hypercholesterolemia, unspecified: Secondary | ICD-10-CM | POA: Diagnosis not present

## 2020-10-08 DIAGNOSIS — N1831 Chronic kidney disease, stage 3a: Secondary | ICD-10-CM | POA: Diagnosis not present

## 2020-10-08 DIAGNOSIS — Z7984 Long term (current) use of oral hypoglycemic drugs: Secondary | ICD-10-CM | POA: Diagnosis not present

## 2020-10-08 DIAGNOSIS — I5189 Other ill-defined heart diseases: Secondary | ICD-10-CM | POA: Diagnosis not present

## 2020-10-08 DIAGNOSIS — E1122 Type 2 diabetes mellitus with diabetic chronic kidney disease: Secondary | ICD-10-CM | POA: Diagnosis not present

## 2020-10-08 DIAGNOSIS — H9190 Unspecified hearing loss, unspecified ear: Secondary | ICD-10-CM | POA: Diagnosis not present

## 2020-10-08 DIAGNOSIS — M810 Age-related osteoporosis without current pathological fracture: Secondary | ICD-10-CM | POA: Diagnosis not present

## 2020-10-08 DIAGNOSIS — I10 Essential (primary) hypertension: Secondary | ICD-10-CM | POA: Diagnosis not present

## 2020-10-08 DIAGNOSIS — H9193 Unspecified hearing loss, bilateral: Secondary | ICD-10-CM | POA: Diagnosis not present

## 2020-10-26 DIAGNOSIS — I10 Essential (primary) hypertension: Secondary | ICD-10-CM | POA: Diagnosis not present

## 2020-10-26 DIAGNOSIS — E7800 Pure hypercholesterolemia, unspecified: Secondary | ICD-10-CM | POA: Diagnosis not present

## 2020-10-26 DIAGNOSIS — M810 Age-related osteoporosis without current pathological fracture: Secondary | ICD-10-CM | POA: Diagnosis not present

## 2020-10-26 DIAGNOSIS — N183 Chronic kidney disease, stage 3 unspecified: Secondary | ICD-10-CM | POA: Diagnosis not present

## 2020-10-26 DIAGNOSIS — I509 Heart failure, unspecified: Secondary | ICD-10-CM | POA: Diagnosis not present

## 2020-10-26 DIAGNOSIS — I130 Hypertensive heart and chronic kidney disease with heart failure and stage 1 through stage 4 chronic kidney disease, or unspecified chronic kidney disease: Secondary | ICD-10-CM | POA: Diagnosis not present

## 2020-10-26 DIAGNOSIS — E785 Hyperlipidemia, unspecified: Secondary | ICD-10-CM | POA: Diagnosis not present

## 2020-10-26 DIAGNOSIS — E1122 Type 2 diabetes mellitus with diabetic chronic kidney disease: Secondary | ICD-10-CM | POA: Diagnosis not present

## 2020-10-31 ENCOUNTER — Other Ambulatory Visit: Payer: Self-pay

## 2020-10-31 ENCOUNTER — Encounter (INDEPENDENT_AMBULATORY_CARE_PROVIDER_SITE_OTHER): Payer: Medicare Other | Admitting: Ophthalmology

## 2020-10-31 DIAGNOSIS — I10 Essential (primary) hypertension: Secondary | ICD-10-CM | POA: Diagnosis not present

## 2020-10-31 DIAGNOSIS — E113312 Type 2 diabetes mellitus with moderate nonproliferative diabetic retinopathy with macular edema, left eye: Secondary | ICD-10-CM

## 2020-10-31 DIAGNOSIS — H35033 Hypertensive retinopathy, bilateral: Secondary | ICD-10-CM

## 2020-10-31 DIAGNOSIS — H43813 Vitreous degeneration, bilateral: Secondary | ICD-10-CM

## 2020-10-31 DIAGNOSIS — E113391 Type 2 diabetes mellitus with moderate nonproliferative diabetic retinopathy without macular edema, right eye: Secondary | ICD-10-CM

## 2020-11-27 ENCOUNTER — Ambulatory Visit (INDEPENDENT_AMBULATORY_CARE_PROVIDER_SITE_OTHER): Payer: Medicare Other | Admitting: Otolaryngology

## 2020-12-05 ENCOUNTER — Encounter (INDEPENDENT_AMBULATORY_CARE_PROVIDER_SITE_OTHER): Payer: Medicare Other | Admitting: Ophthalmology

## 2020-12-07 ENCOUNTER — Encounter (INDEPENDENT_AMBULATORY_CARE_PROVIDER_SITE_OTHER): Payer: Medicare Other | Admitting: Ophthalmology

## 2020-12-07 ENCOUNTER — Other Ambulatory Visit: Payer: Self-pay

## 2020-12-07 DIAGNOSIS — E113391 Type 2 diabetes mellitus with moderate nonproliferative diabetic retinopathy without macular edema, right eye: Secondary | ICD-10-CM | POA: Diagnosis not present

## 2020-12-07 DIAGNOSIS — H43813 Vitreous degeneration, bilateral: Secondary | ICD-10-CM | POA: Diagnosis not present

## 2020-12-07 DIAGNOSIS — H35033 Hypertensive retinopathy, bilateral: Secondary | ICD-10-CM

## 2020-12-07 DIAGNOSIS — E113312 Type 2 diabetes mellitus with moderate nonproliferative diabetic retinopathy with macular edema, left eye: Secondary | ICD-10-CM

## 2020-12-07 DIAGNOSIS — I10 Essential (primary) hypertension: Secondary | ICD-10-CM

## 2020-12-11 ENCOUNTER — Other Ambulatory Visit: Payer: Self-pay

## 2020-12-11 ENCOUNTER — Ambulatory Visit (INDEPENDENT_AMBULATORY_CARE_PROVIDER_SITE_OTHER): Payer: Medicare Other | Admitting: Otolaryngology

## 2020-12-11 DIAGNOSIS — H6123 Impacted cerumen, bilateral: Secondary | ICD-10-CM | POA: Diagnosis not present

## 2020-12-11 NOTE — Progress Notes (Signed)
HPI: Andrea Sweeney is a 87 y.o. female who presents for evaluation of wax buildup in her ears.  She was last cleaned a couple years ago..  Past Medical History:  Diagnosis Date   Arthritis    Cataract    Diabetes mellitus without complication (HCC)    No past surgical history on file. Social History   Socioeconomic History   Marital status: Widowed    Spouse name: Not on file   Number of children: Not on file   Years of education: Not on file   Highest education level: Not on file  Occupational History   Not on file  Tobacco Use   Smoking status: Never   Smokeless tobacco: Never  Substance and Sexual Activity   Alcohol use: No    Alcohol/week: 0.0 standard drinks   Drug use: No   Sexual activity: Not on file  Other Topics Concern   Not on file  Social History Narrative   Not on file   Social Determinants of Health   Financial Resource Strain: Not on file  Food Insecurity: Not on file  Transportation Needs: Not on file  Physical Activity: Not on file  Stress: Not on file  Social Connections: Not on file   Family History  Problem Relation Age of Onset   Cancer Mother    Cancer Father    Diabetes Sister    Cancer Brother    Diabetes Brother    No Known Allergies Prior to Admission medications   Medication Sig Start Date End Date Taking? Authorizing Provider  atorvastatin (LIPITOR) 20 MG tablet Take 20 mg by mouth daily. 03/29/15   [provider]  pioglitazone (ACTOS) 15 MG tablet Take 15 mg by mouth daily. 03/29/15   [provider]     Positive ROS: Otherwise negative   All other systems have been reviewed and were otherwise negative with the exception of those mentioned in the HPI and as above.  Physical Exam: Constitutional: Alert, well-appearing, no acute distress Ears: External ears without lesions or tenderness. Ear canals with a large amount of wax in both ear canals in the lateral portion that was cleaned with forceps and  curettes.  The TMs were otherwise clear. Nasal: External nose without lesions. Clear nasal passages Oral: Oropharynx clear. Neck: No palpable adenopathy or masses Respiratory: Breathing comfortably  Skin: No facial/neck lesions or rash noted.  Cerumen impaction removal  Date/Time: 12/11/2020 3:02 PM Performed by: Drema Halon, MD Authorized by: Drema Halon, MD   Consent:    Consent obtained:  Verbal   Consent given by:  Patient   Risks discussed:  Pain and bleeding Procedure details:    Location:  L ear and R ear   Procedure type: curette and forceps   Post-procedure details:    Inspection:  TM intact and canal normal   Hearing quality:  Improved   Procedure completion:  Tolerated well, no immediate complications Comments:     Large amount of wax in the lateral portion of both ear canals that was cleaned with forceps and curettes.  She also has some hairs down the ear canal that were removed.  Assessment: Bilateral cerumen buildup  Plan: This was cleaned in the office. I discussed with her concerning next time she needs ears cleaned she will have to follow-up with one of the other ENT practices.  Narda Bonds, MD

## 2021-01-18 ENCOUNTER — Encounter (INDEPENDENT_AMBULATORY_CARE_PROVIDER_SITE_OTHER): Payer: Medicare Other | Admitting: Ophthalmology

## 2021-01-21 DIAGNOSIS — I509 Heart failure, unspecified: Secondary | ICD-10-CM | POA: Diagnosis not present

## 2021-01-21 DIAGNOSIS — I10 Essential (primary) hypertension: Secondary | ICD-10-CM | POA: Diagnosis not present

## 2021-01-21 DIAGNOSIS — E7800 Pure hypercholesterolemia, unspecified: Secondary | ICD-10-CM | POA: Diagnosis not present

## 2021-01-21 DIAGNOSIS — E785 Hyperlipidemia, unspecified: Secondary | ICD-10-CM | POA: Diagnosis not present

## 2021-01-21 DIAGNOSIS — M810 Age-related osteoporosis without current pathological fracture: Secondary | ICD-10-CM | POA: Diagnosis not present

## 2021-01-21 DIAGNOSIS — N183 Chronic kidney disease, stage 3 unspecified: Secondary | ICD-10-CM | POA: Diagnosis not present

## 2021-01-21 DIAGNOSIS — E1122 Type 2 diabetes mellitus with diabetic chronic kidney disease: Secondary | ICD-10-CM | POA: Diagnosis not present

## 2021-01-21 DIAGNOSIS — I130 Hypertensive heart and chronic kidney disease with heart failure and stage 1 through stage 4 chronic kidney disease, or unspecified chronic kidney disease: Secondary | ICD-10-CM | POA: Diagnosis not present

## 2021-01-28 ENCOUNTER — Encounter (INDEPENDENT_AMBULATORY_CARE_PROVIDER_SITE_OTHER): Payer: Medicare Other | Admitting: Ophthalmology

## 2021-01-28 ENCOUNTER — Other Ambulatory Visit: Payer: Self-pay

## 2021-01-28 DIAGNOSIS — E113391 Type 2 diabetes mellitus with moderate nonproliferative diabetic retinopathy without macular edema, right eye: Secondary | ICD-10-CM | POA: Diagnosis not present

## 2021-01-28 DIAGNOSIS — H43813 Vitreous degeneration, bilateral: Secondary | ICD-10-CM | POA: Diagnosis not present

## 2021-01-28 DIAGNOSIS — I10 Essential (primary) hypertension: Secondary | ICD-10-CM

## 2021-01-28 DIAGNOSIS — H35033 Hypertensive retinopathy, bilateral: Secondary | ICD-10-CM | POA: Diagnosis not present

## 2021-01-28 DIAGNOSIS — E113312 Type 2 diabetes mellitus with moderate nonproliferative diabetic retinopathy with macular edema, left eye: Secondary | ICD-10-CM | POA: Diagnosis not present

## 2021-03-11 ENCOUNTER — Other Ambulatory Visit: Payer: Self-pay

## 2021-03-11 ENCOUNTER — Encounter (INDEPENDENT_AMBULATORY_CARE_PROVIDER_SITE_OTHER): Payer: Medicare Other | Admitting: Ophthalmology

## 2021-03-11 DIAGNOSIS — E113391 Type 2 diabetes mellitus with moderate nonproliferative diabetic retinopathy without macular edema, right eye: Secondary | ICD-10-CM

## 2021-03-11 DIAGNOSIS — I10 Essential (primary) hypertension: Secondary | ICD-10-CM | POA: Diagnosis not present

## 2021-03-11 DIAGNOSIS — H35033 Hypertensive retinopathy, bilateral: Secondary | ICD-10-CM | POA: Diagnosis not present

## 2021-03-11 DIAGNOSIS — E113312 Type 2 diabetes mellitus with moderate nonproliferative diabetic retinopathy with macular edema, left eye: Secondary | ICD-10-CM | POA: Diagnosis not present

## 2021-03-11 DIAGNOSIS — H43813 Vitreous degeneration, bilateral: Secondary | ICD-10-CM | POA: Diagnosis not present

## 2021-04-08 DIAGNOSIS — H9190 Unspecified hearing loss, unspecified ear: Secondary | ICD-10-CM | POA: Diagnosis not present

## 2021-04-08 DIAGNOSIS — E1165 Type 2 diabetes mellitus with hyperglycemia: Secondary | ICD-10-CM | POA: Diagnosis not present

## 2021-04-08 DIAGNOSIS — E7800 Pure hypercholesterolemia, unspecified: Secondary | ICD-10-CM | POA: Diagnosis not present

## 2021-04-08 DIAGNOSIS — I10 Essential (primary) hypertension: Secondary | ICD-10-CM | POA: Diagnosis not present

## 2021-04-08 DIAGNOSIS — N1831 Chronic kidney disease, stage 3a: Secondary | ICD-10-CM | POA: Diagnosis not present

## 2021-04-08 DIAGNOSIS — E0822 Diabetes mellitus due to underlying condition with diabetic chronic kidney disease: Secondary | ICD-10-CM | POA: Diagnosis not present

## 2021-04-08 DIAGNOSIS — I5189 Other ill-defined heart diseases: Secondary | ICD-10-CM | POA: Diagnosis not present

## 2021-04-22 ENCOUNTER — Encounter (INDEPENDENT_AMBULATORY_CARE_PROVIDER_SITE_OTHER): Payer: Medicare Other | Admitting: Ophthalmology

## 2021-04-23 DIAGNOSIS — Z23 Encounter for immunization: Secondary | ICD-10-CM | POA: Diagnosis not present

## 2021-04-23 DIAGNOSIS — R413 Other amnesia: Secondary | ICD-10-CM | POA: Diagnosis not present

## 2021-04-29 ENCOUNTER — Other Ambulatory Visit: Payer: Self-pay

## 2021-04-29 ENCOUNTER — Encounter (INDEPENDENT_AMBULATORY_CARE_PROVIDER_SITE_OTHER): Payer: Medicare Other | Admitting: Ophthalmology

## 2021-04-29 DIAGNOSIS — H35033 Hypertensive retinopathy, bilateral: Secondary | ICD-10-CM | POA: Diagnosis not present

## 2021-04-29 DIAGNOSIS — I10 Essential (primary) hypertension: Secondary | ICD-10-CM

## 2021-04-29 DIAGNOSIS — E113312 Type 2 diabetes mellitus with moderate nonproliferative diabetic retinopathy with macular edema, left eye: Secondary | ICD-10-CM

## 2021-04-29 DIAGNOSIS — E113391 Type 2 diabetes mellitus with moderate nonproliferative diabetic retinopathy without macular edema, right eye: Secondary | ICD-10-CM | POA: Diagnosis not present

## 2021-04-29 DIAGNOSIS — H43813 Vitreous degeneration, bilateral: Secondary | ICD-10-CM | POA: Diagnosis not present

## 2021-06-03 ENCOUNTER — Encounter (INDEPENDENT_AMBULATORY_CARE_PROVIDER_SITE_OTHER): Payer: Medicare Other | Admitting: Ophthalmology

## 2021-06-03 DIAGNOSIS — H43813 Vitreous degeneration, bilateral: Secondary | ICD-10-CM

## 2021-06-03 DIAGNOSIS — I10 Essential (primary) hypertension: Secondary | ICD-10-CM | POA: Diagnosis not present

## 2021-06-03 DIAGNOSIS — E113391 Type 2 diabetes mellitus with moderate nonproliferative diabetic retinopathy without macular edema, right eye: Secondary | ICD-10-CM

## 2021-06-03 DIAGNOSIS — H35033 Hypertensive retinopathy, bilateral: Secondary | ICD-10-CM | POA: Diagnosis not present

## 2021-06-03 DIAGNOSIS — E113312 Type 2 diabetes mellitus with moderate nonproliferative diabetic retinopathy with macular edema, left eye: Secondary | ICD-10-CM | POA: Diagnosis not present

## 2021-07-15 ENCOUNTER — Encounter (INDEPENDENT_AMBULATORY_CARE_PROVIDER_SITE_OTHER): Payer: Medicare Other | Admitting: Ophthalmology

## 2021-07-15 DIAGNOSIS — I10 Essential (primary) hypertension: Secondary | ICD-10-CM

## 2021-07-15 DIAGNOSIS — E113391 Type 2 diabetes mellitus with moderate nonproliferative diabetic retinopathy without macular edema, right eye: Secondary | ICD-10-CM

## 2021-07-15 DIAGNOSIS — E113312 Type 2 diabetes mellitus with moderate nonproliferative diabetic retinopathy with macular edema, left eye: Secondary | ICD-10-CM

## 2021-07-15 DIAGNOSIS — H43813 Vitreous degeneration, bilateral: Secondary | ICD-10-CM

## 2021-07-15 DIAGNOSIS — H35033 Hypertensive retinopathy, bilateral: Secondary | ICD-10-CM

## 2021-08-15 DIAGNOSIS — I10 Essential (primary) hypertension: Secondary | ICD-10-CM | POA: Diagnosis not present

## 2021-08-15 DIAGNOSIS — N1831 Chronic kidney disease, stage 3a: Secondary | ICD-10-CM | POA: Diagnosis not present

## 2021-08-19 ENCOUNTER — Encounter (INDEPENDENT_AMBULATORY_CARE_PROVIDER_SITE_OTHER): Payer: Medicare Other | Admitting: Ophthalmology

## 2021-09-06 ENCOUNTER — Encounter (INDEPENDENT_AMBULATORY_CARE_PROVIDER_SITE_OTHER): Payer: Medicare Other | Admitting: Ophthalmology

## 2021-09-06 DIAGNOSIS — E113312 Type 2 diabetes mellitus with moderate nonproliferative diabetic retinopathy with macular edema, left eye: Secondary | ICD-10-CM

## 2021-09-06 DIAGNOSIS — E113391 Type 2 diabetes mellitus with moderate nonproliferative diabetic retinopathy without macular edema, right eye: Secondary | ICD-10-CM | POA: Diagnosis not present

## 2021-09-06 DIAGNOSIS — H35033 Hypertensive retinopathy, bilateral: Secondary | ICD-10-CM

## 2021-09-06 DIAGNOSIS — I10 Essential (primary) hypertension: Secondary | ICD-10-CM

## 2021-09-06 DIAGNOSIS — H43813 Vitreous degeneration, bilateral: Secondary | ICD-10-CM

## 2021-10-11 ENCOUNTER — Encounter (INDEPENDENT_AMBULATORY_CARE_PROVIDER_SITE_OTHER): Payer: Medicare Other | Admitting: Ophthalmology

## 2021-10-11 DIAGNOSIS — E113391 Type 2 diabetes mellitus with moderate nonproliferative diabetic retinopathy without macular edema, right eye: Secondary | ICD-10-CM

## 2021-10-11 DIAGNOSIS — E113312 Type 2 diabetes mellitus with moderate nonproliferative diabetic retinopathy with macular edema, left eye: Secondary | ICD-10-CM | POA: Diagnosis not present

## 2021-10-11 DIAGNOSIS — H43813 Vitreous degeneration, bilateral: Secondary | ICD-10-CM

## 2021-10-11 DIAGNOSIS — H35033 Hypertensive retinopathy, bilateral: Secondary | ICD-10-CM

## 2021-10-11 DIAGNOSIS — I10 Essential (primary) hypertension: Secondary | ICD-10-CM | POA: Diagnosis not present

## 2021-10-23 ENCOUNTER — Other Ambulatory Visit: Payer: Self-pay

## 2021-10-23 ENCOUNTER — Ambulatory Visit (HOSPITAL_COMMUNITY)
Admission: EM | Admit: 2021-10-23 | Discharge: 2021-10-23 | Disposition: A | Discharge status: Home / Self Care | Payer: Medicare Other | Attending: Internal Medicine | Admitting: Internal Medicine

## 2021-10-23 ENCOUNTER — Encounter (HOSPITAL_COMMUNITY): Payer: Self-pay | Admitting: *Deleted

## 2021-10-23 DIAGNOSIS — H6123 Impacted cerumen, bilateral: Secondary | ICD-10-CM | POA: Diagnosis not present

## 2021-10-23 NOTE — ED Provider Notes (Signed)
[facility]   [id number] 10/23/21 Arrival Time: 7829  ASSESSMENT & PLAN:  1. Bilateral impacted cerumen    -Patient's bilateral ears were irrigated in the office today.  I examined the ears pre cerumen removal and post removal. TM intact bilaterally post removal. Patient felt better after cerumen removal.  Can follow-up with primary provider as needed.  No orders of the defined types were placed in this encounter.  Discharge Instructions   None     Follow-up Information     Renford Dills, MD.   Specialty: Internal Medicine Why: As needed Contact information: [address] [place] [address] [phone]                  Reviewed expectations re: course of current medical issues. Questions answered. Outlined signs and symptoms indicating need for more acute intervention. Patient verbalized understanding. After Visit Summary given.   SUBJECTIVE: Pleasant 88 year old female who presents to urgent care for bilateral ear cerumen removal.  She is hard of hearing but has no complaints of ear pain or discharge.  No LMP recorded. Patient is postmenopausal. History reviewed. No pertinent surgical history.   OBJECTIVE:  Vitals:   10/23/21 0928  BP: (!) 168/53  Pulse: 87  Resp: 18  Temp: 98.8 F (37.1 C)  SpO2: 97%     Physical Exam Vitals reviewed.  Constitutional:      General: She is not in acute distress. HENT:     Head: Normocephalic.     Right Ear: There is impacted cerumen.     Left Ear: There is impacted cerumen.  Cardiovascular:     Rate and Rhythm: Normal rate.  Pulmonary:     Effort: Pulmonary effort is normal.  Musculoskeletal:     Cervical back: Normal range of motion.  Skin:    General: Skin is warm.  Neurological:     General: No focal deficit present.     Mental Status: She is alert.  Psychiatric:        Mood and Affect: Mood normal.      Labs: No results found for this or any previous  visit. Labs Reviewed - No data to display  Imaging: No results found.   No Known Allergies                                             Past Medical History:  Diagnosis Date   Arthritis    Cataract    Diabetes mellitus without complication (HCC)     Social History   Socioeconomic History   Marital status: Widowed    Spouse name: Not on file   Number of children: Not on file   Years of education: Not on file   Highest education level: Not on file  Occupational History   Not on file  Tobacco Use   Smoking status: Never   Smokeless tobacco: Never  Substance and Sexual Activity   Alcohol use: No    Alcohol/week: 0.0 standard drinks of alcohol   Drug use: No   Sexual activity: Not on file  Other Topics Concern   Not on file  Social History Narrative   Not on file   Social Determinants of Health   Financial Resource Strain: Not on file  Food Insecurity: Not on file  Transportation Needs: Not on file  Physical Activity: Not on  file  Stress: Not on file  Social Connections: Not on file  Intimate Partner Violence: Not on file    Family History  Problem Relation Age of Onset   Cancer Mother    Cancer Father    Diabetes Sister    Cancer Brother    Diabetes Brother       Crystal Ellwood, Baldemar Friday, MD 10/23/21 1038

## 2021-10-23 NOTE — ED Triage Notes (Signed)
Family reports Pt wears hearing aids and on last visit to have hearing aids checked there was wax build up in both ears.

## 2021-10-29 ENCOUNTER — Other Ambulatory Visit: Payer: Self-pay | Admitting: Internal Medicine

## 2021-10-29 DIAGNOSIS — H9190 Unspecified hearing loss, unspecified ear: Secondary | ICD-10-CM | POA: Diagnosis not present

## 2021-10-29 DIAGNOSIS — E1122 Type 2 diabetes mellitus with diabetic chronic kidney disease: Secondary | ICD-10-CM | POA: Diagnosis not present

## 2021-10-29 DIAGNOSIS — E0822 Diabetes mellitus due to underlying condition with diabetic chronic kidney disease: Secondary | ICD-10-CM | POA: Diagnosis not present

## 2021-10-29 DIAGNOSIS — Z0000 Encounter for general adult medical examination without abnormal findings: Secondary | ICD-10-CM | POA: Diagnosis not present

## 2021-10-29 DIAGNOSIS — M810 Age-related osteoporosis without current pathological fracture: Secondary | ICD-10-CM

## 2021-10-29 DIAGNOSIS — E7800 Pure hypercholesterolemia, unspecified: Secondary | ICD-10-CM | POA: Diagnosis not present

## 2021-10-29 DIAGNOSIS — N1831 Chronic kidney disease, stage 3a: Secondary | ICD-10-CM | POA: Diagnosis not present

## 2021-11-15 ENCOUNTER — Encounter (INDEPENDENT_AMBULATORY_CARE_PROVIDER_SITE_OTHER): Payer: Medicare Other | Admitting: Ophthalmology

## 2021-11-15 DIAGNOSIS — H35033 Hypertensive retinopathy, bilateral: Secondary | ICD-10-CM

## 2021-11-15 DIAGNOSIS — I10 Essential (primary) hypertension: Secondary | ICD-10-CM

## 2021-11-15 DIAGNOSIS — H43813 Vitreous degeneration, bilateral: Secondary | ICD-10-CM

## 2021-11-15 DIAGNOSIS — E113312 Type 2 diabetes mellitus with moderate nonproliferative diabetic retinopathy with macular edema, left eye: Secondary | ICD-10-CM

## 2021-11-15 DIAGNOSIS — E113391 Type 2 diabetes mellitus with moderate nonproliferative diabetic retinopathy without macular edema, right eye: Secondary | ICD-10-CM

## 2021-12-20 ENCOUNTER — Encounter (INDEPENDENT_AMBULATORY_CARE_PROVIDER_SITE_OTHER): Payer: Medicare Other | Admitting: Ophthalmology

## 2021-12-20 DIAGNOSIS — I10 Essential (primary) hypertension: Secondary | ICD-10-CM

## 2021-12-20 DIAGNOSIS — H35033 Hypertensive retinopathy, bilateral: Secondary | ICD-10-CM

## 2021-12-20 DIAGNOSIS — H43813 Vitreous degeneration, bilateral: Secondary | ICD-10-CM | POA: Diagnosis not present

## 2021-12-20 DIAGNOSIS — E113393 Type 2 diabetes mellitus with moderate nonproliferative diabetic retinopathy without macular edema, bilateral: Secondary | ICD-10-CM | POA: Diagnosis not present

## 2022-01-31 ENCOUNTER — Encounter (INDEPENDENT_AMBULATORY_CARE_PROVIDER_SITE_OTHER): Payer: Medicare Other | Admitting: Ophthalmology

## 2022-01-31 DIAGNOSIS — E113393 Type 2 diabetes mellitus with moderate nonproliferative diabetic retinopathy without macular edema, bilateral: Secondary | ICD-10-CM

## 2022-01-31 DIAGNOSIS — I10 Essential (primary) hypertension: Secondary | ICD-10-CM

## 2022-01-31 DIAGNOSIS — H35033 Hypertensive retinopathy, bilateral: Secondary | ICD-10-CM | POA: Diagnosis not present

## 2022-01-31 DIAGNOSIS — H43813 Vitreous degeneration, bilateral: Secondary | ICD-10-CM

## 2022-04-11 ENCOUNTER — Encounter (INDEPENDENT_AMBULATORY_CARE_PROVIDER_SITE_OTHER): Payer: Medicare Other | Admitting: Ophthalmology

## 2022-04-11 DIAGNOSIS — H35033 Hypertensive retinopathy, bilateral: Secondary | ICD-10-CM | POA: Diagnosis not present

## 2022-04-11 DIAGNOSIS — E113393 Type 2 diabetes mellitus with moderate nonproliferative diabetic retinopathy without macular edema, bilateral: Secondary | ICD-10-CM

## 2022-04-11 DIAGNOSIS — H43813 Vitreous degeneration, bilateral: Secondary | ICD-10-CM | POA: Diagnosis not present

## 2022-04-11 DIAGNOSIS — I10 Essential (primary) hypertension: Secondary | ICD-10-CM | POA: Diagnosis not present

## 2022-04-29 DIAGNOSIS — N1831 Chronic kidney disease, stage 3a: Secondary | ICD-10-CM | POA: Diagnosis not present

## 2022-04-29 DIAGNOSIS — R7989 Other specified abnormal findings of blood chemistry: Secondary | ICD-10-CM | POA: Diagnosis not present

## 2022-04-29 DIAGNOSIS — E0822 Diabetes mellitus due to underlying condition with diabetic chronic kidney disease: Secondary | ICD-10-CM | POA: Diagnosis not present

## 2022-04-29 DIAGNOSIS — M810 Age-related osteoporosis without current pathological fracture: Secondary | ICD-10-CM | POA: Diagnosis not present

## 2022-04-29 DIAGNOSIS — D696 Thrombocytopenia, unspecified: Secondary | ICD-10-CM | POA: Diagnosis not present

## 2022-04-29 DIAGNOSIS — H9190 Unspecified hearing loss, unspecified ear: Secondary | ICD-10-CM | POA: Diagnosis not present

## 2022-04-29 DIAGNOSIS — I509 Heart failure, unspecified: Secondary | ICD-10-CM | POA: Diagnosis not present

## 2022-04-29 DIAGNOSIS — E1122 Type 2 diabetes mellitus with diabetic chronic kidney disease: Secondary | ICD-10-CM | POA: Diagnosis not present

## 2022-04-29 DIAGNOSIS — E1165 Type 2 diabetes mellitus with hyperglycemia: Secondary | ICD-10-CM | POA: Diagnosis not present

## 2022-05-08 DIAGNOSIS — R7989 Other specified abnormal findings of blood chemistry: Secondary | ICD-10-CM | POA: Diagnosis not present

## 2022-07-04 ENCOUNTER — Encounter (INDEPENDENT_AMBULATORY_CARE_PROVIDER_SITE_OTHER): Payer: Medicare Other | Admitting: Ophthalmology

## 2022-07-04 DIAGNOSIS — H35033 Hypertensive retinopathy, bilateral: Secondary | ICD-10-CM

## 2022-07-04 DIAGNOSIS — E113391 Type 2 diabetes mellitus with moderate nonproliferative diabetic retinopathy without macular edema, right eye: Secondary | ICD-10-CM

## 2022-07-04 DIAGNOSIS — H43813 Vitreous degeneration, bilateral: Secondary | ICD-10-CM

## 2022-07-04 DIAGNOSIS — Z7984 Long term (current) use of oral hypoglycemic drugs: Secondary | ICD-10-CM | POA: Diagnosis not present

## 2022-07-04 DIAGNOSIS — I10 Essential (primary) hypertension: Secondary | ICD-10-CM

## 2022-07-04 DIAGNOSIS — E113312 Type 2 diabetes mellitus with moderate nonproliferative diabetic retinopathy with macular edema, left eye: Secondary | ICD-10-CM | POA: Diagnosis not present

## 2022-09-11 DIAGNOSIS — H6123 Impacted cerumen, bilateral: Secondary | ICD-10-CM | POA: Diagnosis not present

## 2022-09-26 ENCOUNTER — Encounter (INDEPENDENT_AMBULATORY_CARE_PROVIDER_SITE_OTHER): Payer: Medicare Other | Admitting: Ophthalmology

## 2022-09-26 DIAGNOSIS — Z7984 Long term (current) use of oral hypoglycemic drugs: Secondary | ICD-10-CM | POA: Diagnosis not present

## 2022-09-26 DIAGNOSIS — E113391 Type 2 diabetes mellitus with moderate nonproliferative diabetic retinopathy without macular edema, right eye: Secondary | ICD-10-CM

## 2022-09-26 DIAGNOSIS — E113312 Type 2 diabetes mellitus with moderate nonproliferative diabetic retinopathy with macular edema, left eye: Secondary | ICD-10-CM

## 2022-09-26 DIAGNOSIS — H35033 Hypertensive retinopathy, bilateral: Secondary | ICD-10-CM

## 2022-09-26 DIAGNOSIS — H43813 Vitreous degeneration, bilateral: Secondary | ICD-10-CM | POA: Diagnosis not present

## 2022-09-26 DIAGNOSIS — I10 Essential (primary) hypertension: Secondary | ICD-10-CM

## 2022-10-28 DIAGNOSIS — I10 Essential (primary) hypertension: Secondary | ICD-10-CM | POA: Diagnosis not present

## 2022-10-28 DIAGNOSIS — D696 Thrombocytopenia, unspecified: Secondary | ICD-10-CM | POA: Diagnosis not present

## 2022-10-28 DIAGNOSIS — N1832 Chronic kidney disease, stage 3b: Secondary | ICD-10-CM | POA: Diagnosis not present

## 2022-10-28 DIAGNOSIS — E7800 Pure hypercholesterolemia, unspecified: Secondary | ICD-10-CM | POA: Diagnosis not present

## 2022-10-28 DIAGNOSIS — E1165 Type 2 diabetes mellitus with hyperglycemia: Secondary | ICD-10-CM | POA: Diagnosis not present

## 2022-10-28 DIAGNOSIS — H9193 Unspecified hearing loss, bilateral: Secondary | ICD-10-CM | POA: Diagnosis not present

## 2022-10-28 DIAGNOSIS — E1122 Type 2 diabetes mellitus with diabetic chronic kidney disease: Secondary | ICD-10-CM | POA: Diagnosis not present

## 2022-11-04 DIAGNOSIS — Z0000 Encounter for general adult medical examination without abnormal findings: Secondary | ICD-10-CM | POA: Diagnosis not present

## 2022-12-12 ENCOUNTER — Encounter (INDEPENDENT_AMBULATORY_CARE_PROVIDER_SITE_OTHER): Payer: Medicare Other | Admitting: Ophthalmology

## 2022-12-12 DIAGNOSIS — H43813 Vitreous degeneration, bilateral: Secondary | ICD-10-CM

## 2022-12-12 DIAGNOSIS — I10 Essential (primary) hypertension: Secondary | ICD-10-CM

## 2022-12-12 DIAGNOSIS — E113312 Type 2 diabetes mellitus with moderate nonproliferative diabetic retinopathy with macular edema, left eye: Secondary | ICD-10-CM

## 2022-12-12 DIAGNOSIS — Z7984 Long term (current) use of oral hypoglycemic drugs: Secondary | ICD-10-CM

## 2022-12-12 DIAGNOSIS — H35033 Hypertensive retinopathy, bilateral: Secondary | ICD-10-CM

## 2022-12-12 DIAGNOSIS — E113391 Type 2 diabetes mellitus with moderate nonproliferative diabetic retinopathy without macular edema, right eye: Secondary | ICD-10-CM

## 2023-03-06 ENCOUNTER — Encounter (INDEPENDENT_AMBULATORY_CARE_PROVIDER_SITE_OTHER): Payer: Medicare Other | Admitting: Ophthalmology

## 2023-03-06 DIAGNOSIS — H43813 Vitreous degeneration, bilateral: Secondary | ICD-10-CM | POA: Diagnosis not present

## 2023-03-06 DIAGNOSIS — H35033 Hypertensive retinopathy, bilateral: Secondary | ICD-10-CM | POA: Diagnosis not present

## 2023-03-06 DIAGNOSIS — I10 Essential (primary) hypertension: Secondary | ICD-10-CM | POA: Diagnosis not present

## 2023-03-06 DIAGNOSIS — E113312 Type 2 diabetes mellitus with moderate nonproliferative diabetic retinopathy with macular edema, left eye: Secondary | ICD-10-CM | POA: Diagnosis not present

## 2023-03-06 DIAGNOSIS — E113391 Type 2 diabetes mellitus with moderate nonproliferative diabetic retinopathy without macular edema, right eye: Secondary | ICD-10-CM

## 2023-03-06 DIAGNOSIS — Z7984 Long term (current) use of oral hypoglycemic drugs: Secondary | ICD-10-CM | POA: Diagnosis not present

## 2023-04-27 DIAGNOSIS — E1165 Type 2 diabetes mellitus with hyperglycemia: Secondary | ICD-10-CM | POA: Diagnosis not present

## 2023-04-27 DIAGNOSIS — I5189 Other ill-defined heart diseases: Secondary | ICD-10-CM | POA: Diagnosis not present

## 2023-04-27 DIAGNOSIS — E1122 Type 2 diabetes mellitus with diabetic chronic kidney disease: Secondary | ICD-10-CM | POA: Diagnosis not present

## 2023-04-27 DIAGNOSIS — N1831 Chronic kidney disease, stage 3a: Secondary | ICD-10-CM | POA: Diagnosis not present

## 2023-04-27 DIAGNOSIS — D696 Thrombocytopenia, unspecified: Secondary | ICD-10-CM | POA: Diagnosis not present

## 2023-04-27 DIAGNOSIS — M810 Age-related osteoporosis without current pathological fracture: Secondary | ICD-10-CM | POA: Diagnosis not present

## 2023-04-27 DIAGNOSIS — H9190 Unspecified hearing loss, unspecified ear: Secondary | ICD-10-CM | POA: Diagnosis not present

## 2023-05-29 ENCOUNTER — Encounter (INDEPENDENT_AMBULATORY_CARE_PROVIDER_SITE_OTHER): Payer: Medicare Other | Admitting: Ophthalmology

## 2023-05-29 DIAGNOSIS — Z7984 Long term (current) use of oral hypoglycemic drugs: Secondary | ICD-10-CM

## 2023-05-29 DIAGNOSIS — E113312 Type 2 diabetes mellitus with moderate nonproliferative diabetic retinopathy with macular edema, left eye: Secondary | ICD-10-CM | POA: Diagnosis not present

## 2023-05-29 DIAGNOSIS — H43813 Vitreous degeneration, bilateral: Secondary | ICD-10-CM | POA: Diagnosis not present

## 2023-05-29 DIAGNOSIS — H35033 Hypertensive retinopathy, bilateral: Secondary | ICD-10-CM

## 2023-05-29 DIAGNOSIS — I10 Essential (primary) hypertension: Secondary | ICD-10-CM | POA: Diagnosis not present

## 2023-05-29 DIAGNOSIS — E113391 Type 2 diabetes mellitus with moderate nonproliferative diabetic retinopathy without macular edema, right eye: Secondary | ICD-10-CM

## 2023-07-02 DIAGNOSIS — H6123 Impacted cerumen, bilateral: Secondary | ICD-10-CM | POA: Diagnosis not present

## 2023-09-11 ENCOUNTER — Encounter (INDEPENDENT_AMBULATORY_CARE_PROVIDER_SITE_OTHER): Admitting: Ophthalmology

## 2023-09-11 DIAGNOSIS — E113312 Type 2 diabetes mellitus with moderate nonproliferative diabetic retinopathy with macular edema, left eye: Secondary | ICD-10-CM | POA: Diagnosis not present

## 2023-09-11 DIAGNOSIS — Z7984 Long term (current) use of oral hypoglycemic drugs: Secondary | ICD-10-CM | POA: Diagnosis not present

## 2023-09-11 DIAGNOSIS — H43813 Vitreous degeneration, bilateral: Secondary | ICD-10-CM | POA: Diagnosis not present

## 2023-09-11 DIAGNOSIS — H35033 Hypertensive retinopathy, bilateral: Secondary | ICD-10-CM

## 2023-09-11 DIAGNOSIS — E113391 Type 2 diabetes mellitus with moderate nonproliferative diabetic retinopathy without macular edema, right eye: Secondary | ICD-10-CM | POA: Diagnosis not present

## 2023-09-11 DIAGNOSIS — I10 Essential (primary) hypertension: Secondary | ICD-10-CM

## 2023-11-04 DIAGNOSIS — D696 Thrombocytopenia, unspecified: Secondary | ICD-10-CM | POA: Diagnosis not present

## 2023-11-04 DIAGNOSIS — M810 Age-related osteoporosis without current pathological fracture: Secondary | ICD-10-CM | POA: Diagnosis not present

## 2023-11-04 DIAGNOSIS — Z23 Encounter for immunization: Secondary | ICD-10-CM | POA: Diagnosis not present

## 2023-11-04 DIAGNOSIS — H9190 Unspecified hearing loss, unspecified ear: Secondary | ICD-10-CM | POA: Diagnosis not present

## 2023-11-04 DIAGNOSIS — Z0000 Encounter for general adult medical examination without abnormal findings: Secondary | ICD-10-CM | POA: Diagnosis not present

## 2023-11-04 DIAGNOSIS — N1831 Chronic kidney disease, stage 3a: Secondary | ICD-10-CM | POA: Diagnosis not present

## 2023-11-04 DIAGNOSIS — I10 Essential (primary) hypertension: Secondary | ICD-10-CM | POA: Diagnosis not present

## 2023-11-04 DIAGNOSIS — E7800 Pure hypercholesterolemia, unspecified: Secondary | ICD-10-CM | POA: Diagnosis not present

## 2023-11-04 DIAGNOSIS — E1165 Type 2 diabetes mellitus with hyperglycemia: Secondary | ICD-10-CM | POA: Diagnosis not present

## 2023-11-04 DIAGNOSIS — I5189 Other ill-defined heart diseases: Secondary | ICD-10-CM | POA: Diagnosis not present

## 2023-11-04 DIAGNOSIS — E1122 Type 2 diabetes mellitus with diabetic chronic kidney disease: Secondary | ICD-10-CM | POA: Diagnosis not present

## 2023-12-11 ENCOUNTER — Encounter (INDEPENDENT_AMBULATORY_CARE_PROVIDER_SITE_OTHER): Admitting: Ophthalmology

## 2023-12-18 DIAGNOSIS — J069 Acute upper respiratory infection, unspecified: Secondary | ICD-10-CM | POA: Diagnosis not present

## 2023-12-22 ENCOUNTER — Encounter (INDEPENDENT_AMBULATORY_CARE_PROVIDER_SITE_OTHER): Admitting: Ophthalmology

## 2023-12-22 DIAGNOSIS — H35033 Hypertensive retinopathy, bilateral: Secondary | ICD-10-CM

## 2023-12-22 DIAGNOSIS — H43813 Vitreous degeneration, bilateral: Secondary | ICD-10-CM | POA: Diagnosis not present

## 2023-12-22 DIAGNOSIS — E113312 Type 2 diabetes mellitus with moderate nonproliferative diabetic retinopathy with macular edema, left eye: Secondary | ICD-10-CM

## 2023-12-22 DIAGNOSIS — I10 Essential (primary) hypertension: Secondary | ICD-10-CM | POA: Diagnosis not present

## 2023-12-22 DIAGNOSIS — Z7984 Long term (current) use of oral hypoglycemic drugs: Secondary | ICD-10-CM | POA: Diagnosis not present

## 2023-12-22 DIAGNOSIS — E113291 Type 2 diabetes mellitus with mild nonproliferative diabetic retinopathy without macular edema, right eye: Secondary | ICD-10-CM | POA: Diagnosis not present

## 2024-03-29 ENCOUNTER — Encounter (INDEPENDENT_AMBULATORY_CARE_PROVIDER_SITE_OTHER): Admitting: Ophthalmology

## 2024-03-30 ENCOUNTER — Inpatient Hospital Stay (HOSPITAL_COMMUNITY)

## 2024-03-30 ENCOUNTER — Inpatient Hospital Stay (HOSPITAL_COMMUNITY)
Admission: EM | Admit: 2024-03-30 | Discharge: 2024-04-03 | DRG: 193 | Disposition: E | Attending: Internal Medicine | Admitting: Internal Medicine

## 2024-03-30 ENCOUNTER — Emergency Department (HOSPITAL_COMMUNITY)

## 2024-03-30 DIAGNOSIS — I509 Heart failure, unspecified: Secondary | ICD-10-CM

## 2024-03-30 DIAGNOSIS — Z1152 Encounter for screening for COVID-19: Secondary | ICD-10-CM

## 2024-03-30 DIAGNOSIS — N189 Chronic kidney disease, unspecified: Secondary | ICD-10-CM | POA: Diagnosis present

## 2024-03-30 DIAGNOSIS — J810 Acute pulmonary edema: Secondary | ICD-10-CM

## 2024-03-30 DIAGNOSIS — E1122 Type 2 diabetes mellitus with diabetic chronic kidney disease: Secondary | ICD-10-CM | POA: Diagnosis present

## 2024-03-30 DIAGNOSIS — Z833 Family history of diabetes mellitus: Secondary | ICD-10-CM

## 2024-03-30 DIAGNOSIS — I2489 Other forms of acute ischemic heart disease: Secondary | ICD-10-CM | POA: Diagnosis present

## 2024-03-30 DIAGNOSIS — D696 Thrombocytopenia, unspecified: Secondary | ICD-10-CM | POA: Diagnosis present

## 2024-03-30 DIAGNOSIS — Z7984 Long term (current) use of oral hypoglycemic drugs: Secondary | ICD-10-CM

## 2024-03-30 DIAGNOSIS — N270 Small kidney, unilateral: Secondary | ICD-10-CM | POA: Diagnosis present

## 2024-03-30 DIAGNOSIS — R0902 Hypoxemia: Principal | ICD-10-CM

## 2024-03-30 DIAGNOSIS — I452 Bifascicular block: Secondary | ICD-10-CM | POA: Diagnosis present

## 2024-03-30 DIAGNOSIS — J121 Respiratory syncytial virus pneumonia: Principal | ICD-10-CM | POA: Diagnosis present

## 2024-03-30 DIAGNOSIS — B338 Other specified viral diseases: Secondary | ICD-10-CM

## 2024-03-30 DIAGNOSIS — E785 Hyperlipidemia, unspecified: Secondary | ICD-10-CM | POA: Diagnosis present

## 2024-03-30 DIAGNOSIS — N179 Acute kidney failure, unspecified: Secondary | ICD-10-CM | POA: Diagnosis present

## 2024-03-30 DIAGNOSIS — H9190 Unspecified hearing loss, unspecified ear: Secondary | ICD-10-CM | POA: Diagnosis present

## 2024-03-30 DIAGNOSIS — D539 Nutritional anemia, unspecified: Secondary | ICD-10-CM | POA: Diagnosis present

## 2024-03-30 DIAGNOSIS — I468 Cardiac arrest due to other underlying condition: Secondary | ICD-10-CM | POA: Diagnosis not present

## 2024-03-30 DIAGNOSIS — E119 Type 2 diabetes mellitus without complications: Secondary | ICD-10-CM

## 2024-03-30 DIAGNOSIS — Z79899 Other long term (current) drug therapy: Secondary | ICD-10-CM

## 2024-03-30 DIAGNOSIS — J9601 Acute respiratory failure with hypoxia: Secondary | ICD-10-CM | POA: Diagnosis present

## 2024-03-30 DIAGNOSIS — I5033 Acute on chronic diastolic (congestive) heart failure: Secondary | ICD-10-CM | POA: Diagnosis present

## 2024-03-30 LAB — CBC WITH DIFFERENTIAL/PLATELET
Abs Immature Granulocytes: 0.09 10*3/uL — ABNORMAL HIGH (ref 0.00–0.07)
Basophils Absolute: 0 10*3/uL (ref 0.0–0.1)
Basophils Relative: 0 %
Eosinophils Absolute: 0 10*3/uL (ref 0.0–0.5)
Eosinophils Relative: 0 %
HCT: 33.9 % — ABNORMAL LOW (ref 36.0–46.0)
Hemoglobin: 10.1 g/dL — ABNORMAL LOW (ref 12.0–15.0)
Immature Granulocytes: 1 %
Lymphocytes Relative: 7 %
Lymphs Abs: 0.7 10*3/uL (ref 0.7–4.0)
MCH: 30.4 pg (ref 26.0–34.0)
MCHC: 29.8 g/dL — ABNORMAL LOW (ref 30.0–36.0)
MCV: 102.1 fL — ABNORMAL HIGH (ref 80.0–100.0)
Monocytes Absolute: 1 10*3/uL (ref 0.1–1.0)
Monocytes Relative: 11 %
Neutro Abs: 7.9 10*3/uL — ABNORMAL HIGH (ref 1.7–7.7)
Neutrophils Relative %: 81 %
Platelets: 137 10*3/uL — ABNORMAL LOW (ref 150–400)
RBC: 3.32 MIL/uL — ABNORMAL LOW (ref 3.87–5.11)
RDW: 13.5 % (ref 11.5–15.5)
WBC: 9.7 10*3/uL (ref 4.0–10.5)
nRBC: 0.5 % — ABNORMAL HIGH (ref 0.0–0.2)

## 2024-03-30 LAB — BASIC METABOLIC PANEL WITH GFR
Anion gap: 14 (ref 5–15)
BUN: 69 mg/dL — ABNORMAL HIGH (ref 8–23)
CO2: 23 mmol/L (ref 22–32)
Calcium: 9.1 mg/dL (ref 8.9–10.3)
Chloride: 103 mmol/L (ref 98–111)
Creatinine, Ser: 2.35 mg/dL — ABNORMAL HIGH (ref 0.44–1.00)
GFR, Estimated: 19 mL/min — ABNORMAL LOW
Glucose, Bld: 156 mg/dL — ABNORMAL HIGH (ref 70–99)
Potassium: 5.1 mmol/L (ref 3.5–5.1)
Sodium: 140 mmol/L (ref 135–145)

## 2024-03-30 LAB — RESP PANEL BY RT-PCR (RSV, FLU A&B, COVID)  RVPGX2
Influenza A by PCR: NEGATIVE
Influenza B by PCR: NEGATIVE
Resp Syncytial Virus by PCR: POSITIVE — AB
SARS Coronavirus 2 by RT PCR: NEGATIVE

## 2024-03-30 LAB — CBG MONITORING, ED: Glucose-Capillary: 127 mg/dL — ABNORMAL HIGH (ref 70–99)

## 2024-03-30 LAB — PRO BRAIN NATRIURETIC PEPTIDE: Pro Brain Natriuretic Peptide: >35000 pg/mL — ABNORMAL HIGH

## 2024-03-30 MED ORDER — ACETAMINOPHEN 650 MG RE SUPP: 650.0000 mg | Freq: Four times a day (QID) | RECTAL | Status: DC | PRN

## 2024-03-30 MED ORDER — INSULIN ASPART 100 UNIT/ML IJ SOLN: 0.0000 [IU] | Freq: Every day | INTRAMUSCULAR | Status: DC

## 2024-03-30 MED ORDER — FUROSEMIDE 10 MG/ML IJ SOLN
40.0000 mg | Freq: Two times a day (BID) | INTRAMUSCULAR | Status: DC
  Administered 2024-03-31 – 2024-04-01 (×4): 40 mg via INTRAVENOUS
  Filled 2024-03-30 (×4): qty 4

## 2024-03-30 MED ORDER — FUROSEMIDE 10 MG/ML IJ SOLN
40.0000 mg | Freq: Once | INTRAMUSCULAR | Status: AC
  Administered 2024-03-30: 40 mg via INTRAVENOUS
  Filled 2024-03-30: qty 4

## 2024-03-30 MED ORDER — DM-GUAIFENESIN ER 30-600 MG PO TB12: 1.0000 | ORAL_TABLET | Freq: Two times a day (BID) | ORAL | Status: DC | PRN

## 2024-03-30 MED ORDER — IPRATROPIUM-ALBUTEROL 0.5-2.5 (3) MG/3ML IN SOLN
3.0000 mL | Freq: Once | RESPIRATORY_TRACT | Status: AC
  Administered 2024-03-30: 3 mL via RESPIRATORY_TRACT
  Filled 2024-03-30: qty 3

## 2024-03-30 MED ORDER — INSULIN ASPART 100 UNIT/ML IJ SOLN
0.0000 [IU] | Freq: Three times a day (TID) | INTRAMUSCULAR | Status: DC
  Administered 2024-04-01 (×2): 1 [IU] via SUBCUTANEOUS
  Filled 2024-03-30 (×2): qty 1

## 2024-03-30 MED ORDER — ACETAMINOPHEN 325 MG PO TABS: 650.0000 mg | ORAL_TABLET | Freq: Four times a day (QID) | ORAL | Status: DC | PRN

## 2024-03-30 NOTE — ED Provider Notes (Signed)
 " [place] EMERGENCY DEPARTMENT AT [facility] Provider Note   CSN: [id number] Arrival date & time: 03/30/24  1814     Patient presents with: Weakness   Andrea Sweeney is a 89 y.o. female.   89 year old female with past medical history of diabetes and hyperlipidemia presenting to the emergency department today with cough and generalized weakness.  These have both been going now for the past 2 days.  The patient has had decreased appetite with this.  Has not really been eating or drinking.  She has had some generalized weakness as well.  Denies any nausea or vomiting.  Her family brought her in for further evaluation due to these ongoing symptoms.  The patient was found to be hypoxic on room air on arrival.  She does not have any history of COPD or asthma.   Weakness      Prior to Admission medications  Medication Sig Start Date End Date Taking? Authorizing Provider  atorvastatin (LIPITOR) 20 MG tablet Take 20 mg by mouth daily. 03/29/15   [provider]  pioglitazone (ACTOS) 15 MG tablet Take 15 mg by mouth daily. 03/29/15   [provider]    Allergies: Patient has no known allergies.    Review of Systems  Neurological:  Positive for weakness.  All other systems reviewed and are negative.   Updated Vital Signs BP (!) 144/62   Pulse 77   Temp 98.1 F (36.7 C) (Axillary)   Resp 16   SpO2 96%   Physical Exam Vitals and nursing note reviewed.   Gen: Chronically ill appearing, NAD Eyes: PERRL, EOMI HEENT: no oropharyngeal swelling Neck: trachea midline Resp: Coarse breath sounds throughout all lung fields with some scattered wheezes noted Card: RRR, no murmurs, rubs, or gallops Abd: nontender, nondistended Extremities: no calf tenderness, no edema Vascular: 2+ radial pulses bilaterally, 2+ DP pulses bilaterally Skin: no rashes Psyc: acting appropriately   (all labs ordered are listed, but only abnormal results are displayed) Labs  Reviewed  RESP PANEL BY RT-PCR (RSV, FLU A&B, COVID)  RVPGX2 - Abnormal; Notable for the following components:      Result Value   Resp Syncytial Virus by PCR POSITIVE (*)    All other components within normal limits  BASIC METABOLIC PANEL WITH GFR - Abnormal; Notable for the following components:   Glucose, Bld 156 (*)    BUN 69 (*)    Creatinine, Ser 2.35 (*)    GFR, Estimated 19 (*)    All other components within normal limits  PRO BRAIN NATRIURETIC PEPTIDE - Abnormal; Notable for the following components:   Pro Brain Natriuretic Peptide >35,000.0 (*)    All other components within normal limits  CBC WITH DIFFERENTIAL/PLATELET - Abnormal; Notable for the following components:   RBC 3.32 (*)    Hemoglobin 10.1 (*)    HCT 33.9 (*)    MCV 102.1 (*)    MCHC 29.8 (*)    Platelets 137 (*)    nRBC 0.5 (*)    Neutro Abs 7.9 (*)    Abs Immature Granulocytes 0.09 (*)    All other components within normal limits    EKG: None  Radiology: DG Chest 2 View Result Date: 03/30/2024 EXAM: 2 VIEW(S) XRAY OF THE CHEST 03/30/2024 09:04:00 PM COMPARISON: 01/01/2018 CLINICAL HISTORY: Shortness of breath. FINDINGS: LUNGS AND PLEURA: Mild pulmonary edema. Patchy bilateral airspace opacities. Trace bilateral pleural effusions. No pneumothorax. HEART AND MEDIASTINUM: Cardiomegaly. Aortic atherosclerotic calcification. Hiatal hernia. BONES AND  SOFT TISSUES: No acute osseous abnormality. IMPRESSION: 1. Mild pulmonary edema and patchy bilateral airspace opacities.Follow-up PA and lateral chest X-ray is recommended in 3-4 weeks following therapy to ensure resolution and exclude underlying malignancy. 2. Trace bilateral pleural effusions. 3. Cardiomegaly. Electronically signed by: Morgane Naveau MD 03/30/2024 09:09 PM EST RP Workstation: [id number]     Procedures   Medications Ordered in the ED  furosemide  (LASIX ) injection 40 mg (has no administration in time range)  ipratropium-albuterol  (DUONEB)  0.5-2.5 (3) MG/3ML nebulizer solution 3 mL (3 mLs Nebulization Given 03/30/24 2013)                                    Medical Decision Making 89 year old female with past medical history of diabetes and hyperlipidemia presenting to the emergency department today with cough and generalized weakness.  The patient does have some coarse breath sounds here.  I will further evaluate the patient here with basic labs as well as a chest x-ray and RSV/COVID/flu swab to evaluate for pneumonia, pulmonary edema, pulmonary filtration, or pneumothorax.  Above workup will also evaluate for flu or viral etiologies.  I will reevaluate for ultimate disposition.  The patient's creatinine is elevated here.  We do not have an old creatinine to compare to.  Her BNP is significantly elevated.  Chest x-ray does show some pulmonary edema.  Viral testing is positive for RSV.  The patient is given a dose of Lasix  here.  Given her hypoxia call is placed to hospital service for admission.  Amount and/or Complexity of Data Reviewed Labs: ordered. Radiology: ordered.  Risk Prescription drug management. Decision regarding hospitalization.        Final diagnoses:  Hypoxia  Infection due to respiratory syncytial virus (RSV), unspecified infection type  Acute pulmonary edema [facility])    ED Discharge Orders     None          Ula Prentice SAUNDERS, MD 03/30/24 2113  "

## 2024-03-30 NOTE — ED Triage Notes (Addendum)
 Pt BIB EMS from home. Family reporting loss of appetite and not being her normal self. Found w rhonchi and rales in every quadrant when supine. Upon sitting up, pt breathed a little easier. Junky cough present. 89% RA, then 97% on 2L. Pt extremely hard of hearing but supossedly AO4 pe r ems . Pt cannot understand nurse well enough to answer any questions d/t hard of hearing. Family says she has had the cough since saturday  EMS vitals  BP 130/72 Hr 78     CBG 164

## 2024-03-30 NOTE — H&P (Signed)
 " History and Physical    Andrea Sweeney FMW:[id number] DOB: 01-27-1929 DOA: 03/30/2024  PCP: Rexanne Ingle, MD  Patient coming from: Home  Chief Complaint: Cough  HPI: Andrea Sweeney is a 89 y.o. female with medical history significant of type 2 diabetes, hyperlipidemia presented to the ED via EMS from home for evaluation of cough and generalized weakness.  SpO2 89% on room air, improved to 97% with 2 L [place].  Afebrile.  Labs showing no leukocytosis, hemoglobin 10.1, MCV 102.1, platelet count 137k, glucose 156, BUN 69, creatinine 2.35, GFR 19, proBNP >35,000, RSV PCR positive.  Chest x-ray showing mild pulmonary edema, patchy bilateral airspace opacities, trace bilateral pleural effusions, and cardiomegaly.  EKG showing sinus rhythm, LAFB, RBBB, LVH, T wave inversions in lateral leads, and QTc 518.  No previous EKG for comparison.  Patient was given IV Lasix  40 mg and DuoNeb in the ED.  TR[facility] called to admit.  Patient is extremely hard of hearing and not able to answer any questions.  No family at bedside.  Informed by nursing staff that patient's daughters were here earlier in the ED but had to leave.  Unfortunately, phone numbers for her daughters were not recorded in the chart/unknown and I am not able to contact them.  I did try to call 2 other family members listed as emergency contacts including [name] (brother) at [phone] and [name] (sister) at [phone] but was not able to reach any family over the phone.  Review of Systems:  Review of Systems  Reason unable to perform ROS: Patient is extremely hard of hearing.    Past Medical History:  Diagnosis Date   Arthritis    Cataract    Diabetes mellitus without complication (HCC)     No past surgical history on file.   reports that she has never smoked. She has never used smokeless tobacco. She reports that she does not drink alcohol and does not use drugs.  Allergies[1]  Family History  Problem Relation Age  of Onset   Cancer Mother    Cancer Father    Diabetes Sister    Cancer Brother    Diabetes Brother     Prior to Admission medications  Medication Sig Start Date End Date Taking? Authorizing Provider  atorvastatin (LIPITOR) 20 MG tablet Take 20 mg by mouth daily. 03/29/15   [provider]  pioglitazone (ACTOS) 15 MG tablet Take 15 mg by mouth daily. 03/29/15   [provider]    Physical Exam: Vitals:   03/30/24 1824 03/30/24 1828 03/30/24 1934 03/30/24 2200  BP: (!) 144/62   107/82  Pulse:  77  73  Resp: 16   (!) 22  Temp: 98.1 F (36.7 C)     TempSrc: Axillary     SpO2: 96%  96% 96%    Physical Exam Vitals reviewed.  Constitutional:      General: She is not in acute distress. HENT:     Head: Normocephalic and atraumatic.  Eyes:     Extraocular Movements: Extraocular movements intact.  Neck:     Comments: +JVD Cardiovascular:     Rate and Rhythm: Normal rate and regular rhythm.     Pulses: Normal pulses.  Pulmonary:     Effort: Pulmonary effort is normal. No respiratory distress.     Breath sounds: Rhonchi and rales present.  Abdominal:     General: Bowel sounds are normal.     Palpations: Abdomen is soft.     Tenderness: There  is no abdominal tenderness. There is no guarding.  Musculoskeletal:     Cervical back: Normal range of motion.     Right lower leg: No edema.     Left lower leg: No edema.  Skin:    General: Skin is warm and dry.  Neurological:     General: No focal deficit present.     Mental Status: She is alert.     Comments: Moving all extremities spontaneously, no focal weakness     Labs on Admission: I have personally reviewed following labs and imaging studies  CBC: Recent Labs  Lab 03/30/24 1906  WBC 9.7  NEUTROABS 7.9*  HGB 10.1*  HCT 33.9*  MCV 102.1*  PLT 137*   Basic Metabolic Panel: Recent Labs  Lab 03/30/24 1906  NA 140  K 5.1  CL 103  CO2 23  GLUCOSE 156*  BUN 69*  CREATININE 2.35*  CALCIUM 9.1    GFR: CrCl cannot be calculated (Unknown ideal weight.). Liver Function Tests: No results for input(s): AST, ALT, ALKPHOS, BILITOT, PROT, ALBUMIN in the last 168 hours. No results for input(s): LIPASE, AMYLASE in the last 168 hours. No results for input(s): AMMONIA in the last 168 hours. Coagulation Profile: No results for input(s): INR, PROTIME in the last 168 hours. Cardiac Enzymes: No results for input(s): CKTOTAL, CKMB, CKMBINDEX, TROPONINI in the last 168 hours. BNP (last 3 results) Recent Labs    03/30/24 1906  PROBNP >35,000.0*   HbA1C: No results for input(s): HGBA1C in the last 72 hours. CBG: No results for input(s): GLUCAP in the last 168 hours. Lipid Profile: No results for input(s): CHOL, HDL, LDLCALC, TRIG, CHOLHDL, LDLDIRECT in the last 72 hours. Thyroid  Function Tests: No results for input(s): TSH, T4TOTAL, FREET4, T3FREE, THYROIDAB in the last 72 hours. Anemia Panel: No results for input(s): VITAMINB12, FOLATE, FERRITIN, TIBC, IRON, RETICCTPCT in the last 72 hours. Urine analysis: No results found for: COLORURINE, APPEARANCEUR, LABSPEC, PHURINE, GLUCOSEU, HGBUR, BILIRUBINUR, KETONESUR, PROTEINUR, UROBILINOGEN, NITRITE, LEUKOCYTESUR  Radiological Exams on Admission: DG Chest 2 View Result Date: 03/30/2024 EXAM: 2 VIEW(S) XRAY OF THE CHEST 03/30/2024 09:04:00 PM COMPARISON: 01/01/2018 CLINICAL HISTORY: Shortness of breath. FINDINGS: LUNGS AND PLEURA: Mild pulmonary edema. Patchy bilateral airspace opacities. Trace bilateral pleural effusions. No pneumothorax. HEART AND MEDIASTINUM: Cardiomegaly. Aortic atherosclerotic calcification. Hiatal hernia. BONES AND SOFT TISSUES: No acute osseous abnormality. IMPRESSION: 1. Mild pulmonary edema and patchy bilateral airspace opacities.Follow-up PA and lateral chest X-ray is recommended in 3-4 weeks following therapy to ensure resolution  and exclude underlying malignancy. 2. Trace bilateral pleural effusions. 3. Cardiomegaly. Electronically signed by: Morgane Naveau MD 03/30/2024 09:09 PM EST RP Workstation: [id number]    Assessment and Plan  Acute CHF exacerbation proBNP >35,000 and chest x-ray showing mild pulmonary edema. Last echo done in November 2019 showing EF 55 to 60%, moderate LVH, grade 1 diastolic dysfunction, mild aortic regurgitation, mild mitral regurgitation, moderate tricuspid regurgitation, and PA peak pressure 68 mmHg.  Continue diuresis with IV Lasix  40 mg twice daily.  Repeat echocardiogram ordered.  Monitor intake and output, daily weights.  Dietary sodium and fluid restriction.  RSV with possible pneumonia RSV PCR positive and chest x-ray showing patchy bilateral airspace opacities concerning for possible pneumonia.  No fever or leukocytosis.  Continue supportive care.  Bacterial pneumonia less likely, check procalcitonin level.  Blood cultures ordered.  Droplet and contact precautions.  Acute hypoxemic respiratory failure In the setting of problems listed above.  SpO2 89% on room air and currently  saturating well on 2 L [place].  No respiratory distress.  Continue supplemental oxygen.  ?AKI versus CKD BUN 69, creatinine 2.35, GFR 19.  Baseline renal function unknown as there are no previous labs for comparison.  ?AKI cardiorenal in etiology in the setting of decompensated CHF versus possible CKD.  Continue diuresis for volume overload as above and monitor renal function very closely.  Avoid nephrotoxic agents.  UA and renal ultrasound ordered.  QT prolongation/abnormal EKG EKG showing sinus rhythm, LAFB, RBBB, LVH, T wave inversions in lateral leads, and QTc 518.  No previous EKG for comparison.  Patient is resting comfortably and not endorsing chest pain.  Check troponin.  No evidence of hypokalemia on labs.  Check magnesium level and replace if low.  Avoid QT prolonging drugs and follow-up repeat EKG in  AM.  Macrocytic anemia Hemoglobin 10.1 with MCV 102.1.  No previous labs for comparison.  No overt bleeding.  Anemia panel and FOBT ordered.  Continue to monitor CBC.  Mild thrombocytopenia Monitor CBC.  Type 2 diabetes Check hemoglobin A1c and placed on sensitive sliding scale insulin  ACHS.  DVT prophylaxis: SCDs until FOBT is checked. Code Status: Full code by default.  Unable to discuss with the patient as she is extremely hard of hearing.  No family available at this time to confirm CODE STATUS.  Needs to be readdressed in the morning once family is available. Level of care: Progressive Care Unit Admission status: It is my clinical opinion that admission to INPATIENT is reasonable and necessary because of the expectation that this patient will require hospital care that crosses at least 2 midnights to treat this condition based on the medical complexity of the problems presented.  Given the aforementioned information, the predictability of an adverse outcome is felt to be significant.  Editha Ram MD [facility]  If 7PM-7AM, please contact night-coverage [email or web address]  03/30/2024, 10:03 PM       [1] No Known Allergies  "

## 2024-03-31 ENCOUNTER — Inpatient Hospital Stay (HOSPITAL_COMMUNITY)

## 2024-03-31 ENCOUNTER — Encounter (HOSPITAL_COMMUNITY): Payer: Self-pay | Admitting: Internal Medicine

## 2024-03-31 ENCOUNTER — Other Ambulatory Visit: Payer: Self-pay

## 2024-03-31 DIAGNOSIS — I509 Heart failure, unspecified: Secondary | ICD-10-CM | POA: Diagnosis not present

## 2024-03-31 DIAGNOSIS — I5033 Acute on chronic diastolic (congestive) heart failure: Secondary | ICD-10-CM

## 2024-03-31 LAB — CBC
HCT: 31.3 % — ABNORMAL LOW (ref 36.0–46.0)
Hemoglobin: 9.2 g/dL — ABNORMAL LOW (ref 12.0–15.0)
MCH: 30.5 pg (ref 26.0–34.0)
MCHC: 29.4 g/dL — ABNORMAL LOW (ref 30.0–36.0)
MCV: 103.6 fL — ABNORMAL HIGH (ref 80.0–100.0)
Platelets: 135 10*3/uL — ABNORMAL LOW (ref 150–400)
RBC: 3.02 MIL/uL — ABNORMAL LOW (ref 3.87–5.11)
RDW: 13.6 % (ref 11.5–15.5)
WBC: 9.9 10*3/uL (ref 4.0–10.5)
nRBC: 0.4 % — ABNORMAL HIGH (ref 0.0–0.2)

## 2024-03-31 LAB — COMPREHENSIVE METABOLIC PANEL WITH GFR
ALT: 23 U/L (ref 0–44)
AST: 36 U/L (ref 15–41)
Albumin: 3.1 g/dL — ABNORMAL LOW (ref 3.5–5.0)
Alkaline Phosphatase: 84 U/L (ref 38–126)
Anion gap: 13 (ref 5–15)
BUN: 62 mg/dL — ABNORMAL HIGH (ref 8–23)
CO2: 20 mmol/L — ABNORMAL LOW (ref 22–32)
Calcium: 7.6 mg/dL — ABNORMAL LOW (ref 8.9–10.3)
Chloride: 109 mmol/L (ref 98–111)
Creatinine, Ser: 2.06 mg/dL — ABNORMAL HIGH (ref 0.44–1.00)
GFR, Estimated: 22 mL/min — ABNORMAL LOW
Glucose, Bld: 121 mg/dL — ABNORMAL HIGH (ref 70–99)
Potassium: 4.5 mmol/L (ref 3.5–5.1)
Sodium: 142 mmol/L (ref 135–145)
Total Bilirubin: <0.2 mg/dL (ref 0.0–1.2)
Total Protein: 6.2 g/dL — ABNORMAL LOW (ref 6.5–8.1)

## 2024-03-31 LAB — URINALYSIS, ROUTINE W REFLEX MICROSCOPIC
Bilirubin Urine: NEGATIVE
Glucose, UA: NEGATIVE mg/dL
Hgb urine dipstick: NEGATIVE
Ketones, ur: NEGATIVE mg/dL
Leukocytes,Ua: NEGATIVE
Nitrite: NEGATIVE
Protein, ur: NEGATIVE mg/dL
Specific Gravity, Urine: 1.013 (ref 1.005–1.030)
pH: 5 (ref 5.0–8.0)

## 2024-03-31 LAB — RETICULOCYTES
Immature Retic Fract: 32 % — ABNORMAL HIGH (ref 2.3–15.9)
RBC.: 3.29 MIL/uL — ABNORMAL LOW (ref 3.87–5.11)
Retic Count, Absolute: 42.1 10*3/uL (ref 19.0–186.0)
Retic Ct Pct: 1.3 % (ref 0.4–3.1)

## 2024-03-31 LAB — HEMOGLOBIN A1C
Hgb A1c MFr Bld: 7 % — ABNORMAL HIGH (ref 4.8–5.6)
Mean Plasma Glucose: 154.2 mg/dL

## 2024-03-31 LAB — FOLATE: Folate: 16.9 ng/mL

## 2024-03-31 LAB — TROPONIN T, HIGH SENSITIVITY
Troponin T High Sensitivity: 141 ng/L (ref 0–19)
Troponin T High Sensitivity: 143 ng/L (ref 0–19)

## 2024-03-31 LAB — FERRITIN: Ferritin: 195 ng/mL (ref 11–307)

## 2024-03-31 LAB — ECHOCARDIOGRAM COMPLETE
Area-P 1/2: 3.27 cm2
P 1/2 time: 563 ms
S' Lateral: 2.4 cm

## 2024-03-31 LAB — VITAMIN B12: Vitamin B-12: 1667 pg/mL — ABNORMAL HIGH (ref 180–914)

## 2024-03-31 LAB — IRON AND TIBC
Iron: 49 ug/dL (ref 28–170)
Saturation Ratios: 21 % (ref 10.4–31.8)
TIBC: 237 ug/dL — ABNORMAL LOW (ref 250–450)
UIBC: 188 ug/dL

## 2024-03-31 LAB — GLUCOSE, CAPILLARY
Glucose-Capillary: 91 mg/dL (ref 70–99)
Glucose-Capillary: 79 mg/dL (ref 70–99)
Glucose-Capillary: 151 mg/dL — ABNORMAL HIGH (ref 70–99)

## 2024-03-31 LAB — PROCALCITONIN: Procalcitonin: 0.96 ng/mL

## 2024-03-31 LAB — MAGNESIUM: Magnesium: 3 mg/dL — ABNORMAL HIGH (ref 1.7–2.4)

## 2024-03-31 LAB — CBG MONITORING, ED: Glucose-Capillary: 98 mg/dL (ref 70–99)

## 2024-03-31 MED ORDER — ADULT MULTIVITAMIN W/MINERALS CH
1.0000 | ORAL_TABLET | Freq: Every day | ORAL | Status: DC
  Filled 2024-03-31: qty 1

## 2024-03-31 MED ORDER — ENSURE PLUS HIGH PROTEIN PO LIQD
237.0000 mL | Freq: Two times a day (BID) | ORAL | Status: DC
  Administered 2024-03-31 – 2024-04-01 (×2): 237 mL via ORAL

## 2024-03-31 NOTE — ED Notes (Addendum)
"   MYNA Lefort: [phone] (Niece)  Dwayne Fireman: [phone] (niece )  "

## 2024-03-31 NOTE — Progress Notes (Addendum)
 PT Cancellation Note  Patient Details Name: Indria Bishara MRN: [id number] DOB: 08-25-1928   Cancelled Treatment:    Reason Eval/Treat Not Completed: Fatigue/lethargy limiting ability to participate. Pt asleep soundly upon PT arrival. Multiple attempts to wake patient including lights on, cold cloth to face and chest, removing blankets, rubbing feet, and sternal rubs however pt remained asleep. Eyes flutter intermittently and pt retracts away from stimulus to feet however remains asleep. PT reported this to RN. HR remains stable at 79 and O2 sats at 95% on 2L. Audible ronchi while pt asleep. PT will attempt evaluation tomorrow.   Isaiah DEL. Aireonna Bauer, PT, DPT   [employer] 03/31/2024, 3:57 PM

## 2024-03-31 NOTE — Progress Notes (Signed)
 " PROGRESS NOTE    Andrea Sweeney  FMW:[id number] DOB: 30-Nov-1928 DOA: 03/30/2024 PCP: Rexanne Ingle, MD    Brief Narrative:   Andrea Sweeney is a 89 y.o. female with past medical history significant for DM2, HLD who presented to [facility] ED on 03/30/2024 from home via EMS with shortness of breath, cough, generalized weakness.  Patient extremely hard of hearing, unable to answer questions and no family present at bedside.  On EMS arrival, patient was noted to be 89% on room air and placed on 2 L nasal cannula.  And transported to the ED for further evaluation and management.  In the ED, temperature 90.1 F, HR 77, RR 16, BP 144/62, SpO2 96% on 2 L nasal cannula.  WBC 9.7, hemoglobin 10.1, platelet count 137.  Sodium 140, potassium 5.1, chloride 103, CO2 23, glucose 156, BUN 69, creatinine 2.35.  BNP greater than 35,000, high-sensitivity troponin 143 followed by 141.  RSV PCR positive.  COVID/influenza A/B PCR negative.  2 view chest x-ray with mild pulm edema, patchy bilateral airspace opacities, trace bilateral pleural effusions, cardiomegaly.  Renal ultrasound with bilateral increased renal echogenicity consistent with chronic medical renal disease, small right kidney.  EKG with NSR, LAFB, RBBB, LVH, T wave inversions lateral leads, QTc 518 with no previous EKG for comparison.  Patient was given furosemide  40 mg IV x 1, DuoNeb in the ED.  TRH consulted for admission.  Assessment & Plan:   Acute hypoxemic respiratory failure Etiology likely secondary to CHF exacerbation coupled with RSV viral infection. -- Continue treatment as below -- Continue supplemental oxygen, maintain SpO2 greater than 92%  Acute on chronic diastolic CHF exacerbation Patient presenting with cough, shortness of breath.  proBNP elevated greater than 35,000 with chest x-ray with mild pulmonary edema, cardiomegaly.  Most recent TTE November 2019 with LVEF 55-60%, moderate LVH, grade 1 diastolic dysfunction, mild  aortic regurgitation, mild MR, moderate TR. -- TTE: Pending -- Furosemide  40 mg IV every 12 hours -- I's and O's, daily weights -- BMP daily  RSV viral infection RSV PCR positive.  Patient was found to be hypoxic on EMS arrival, complicated by CHF exacerbation as well. -- Droplet/contact isolation precautions -- Supportive care, continue oxygen and attempt to wean off; maintain SpO2 greater than 92%  Elevated troponin High-sensitivity troponin elevated 143 followed by 141; flat.  Etiology likely secondary to type II demand ischemia in the setting of CHF exacerbation as above.  Denies chest pain. -- Continue monitor on telemetry  AKI versus CKD BUN 69, creatinine 2.35, GFR 19.  Baseline renal function unknown as there are no previous labs for comparison.  Unclear if AKI, possible cardiorenal in etiology in the setting of decompensated CHF versus possible CKD.  Renal ultrasound with bilateral increased renal echogenicity consistent with chronic medical renal disease, small right kidney. -- Cr 2.35>2.06 -- Continue IV diuresis as above -- Avoid nephrotoxins, renal dose all medications -- Strict I's and O's, monitor urine output -- BMP in a.m.   QT prolongation/abnormal EKG EKG showing sinus rhythm, LAFB, RBBB, LVH, T wave inversions in lateral leads, and QTc 518.  No previous EKG for comparison.  Patient is resting comfortably and not endorsing chest pain.   -- Avoid QT prolonging drugs    Macrocytic anemia Hemoglobin 10.1 with MCV 102.1.  Anemia panel with iron 49, TIBC 237, ferritin 195, folate 16.9, vitamin B12 1667.  No previous labs for comparison.   -- Hgb 10.1>9.2, stable   Mild thrombocytopenia --  Plt 137>135; stable   Type 2 diabetes Hemoglobin A1c 7.0.   -- sensitive sliding scale insulin   -- CBGs before every meal/at bedtime    DVT prophylaxis: SCDs Start: 03/30/24 2232    Code Status: Full Code Family Communication: No family present at bedside this  morning  Disposition Plan:  Level of care: Progressive Status is: Inpatient Remains inpatient appropriate because: IV diuresis, pending TTE    Consultants:  None  Procedures:  TTE: Pending  Antimicrobials:  None   Subjective: The patient seen examined bedside, lying in bed.  Remains in the ED holding area.  Very hard of hearing.  No family present.  Denies chest pain pain and no shortness of breath this morning.  Discussed with RN.  Renal function slightly improved, remains on IV diuresis.  Remains on oxygen.  Awaiting TTE.  Updated patient's niece, Andrea Sweeney by telephone this morning.  Patient currently lives with her sister, uses a cane for ambulation.  Has not been eating well over the last 1 week.  Was previously living with her son but now he is unavailable.  Patient's niece to discuss with her mother regarding CODE STATUS.  Remains full code at this time.  Objective: Vitals:   03/31/24 0600 03/31/24 0700 03/31/24 0745 03/31/24 0813  BP: (!) 101/90 (!) 115/54  (!) 143/58  Pulse: 64 67 78 75  Resp: 18 19 19 18   Temp:    97.8 F (36.6 C)  TempSrc:    Axillary  SpO2: 100% 100% 96% 98%    Intake/Output Summary (Last 24 hours) at 03/31/2024 9062 Last data filed at 03/31/2024 0425 Gross per 24 hour  Intake --  Output 200 ml  Net -200 ml   There were no vitals filed for this visit.  Examination:  Physical Exam: GEN: NAD, very hard of hearing, alert, elderly/chronically ill in appearance HEENT: NCAT, PERRL, EOMI, sclera clear, poor dentition PULM: CTAB w/o wheezes/crackles, normal respiratory effort, on 1 L nasal cannula with SpO2 90% at rest CV: RRR w/o M/G/R GI: abd soft, NTND, + BS MSK: no peripheral edema, moves all extremities independently NEURO: No focal neurological deficit, but difficult exam given hard of hearing PSYCH: normal mood/affect Integumentary: No concerning rash/lesions/wounds nonexposed skin cirrhosis    Data Reviewed: I have personally  reviewed following labs and imaging studies  CBC: Recent Labs  Lab 03/30/24 1906 03/31/24 0415  WBC 9.7 9.9  NEUTROABS 7.9*  --   HGB 10.1* 9.2*  HCT 33.9* 31.3*  MCV 102.1* 103.6*  PLT 137* 135*   Basic Metabolic Panel: Recent Labs  Lab 03/30/24 1906 03/30/24 2330 03/31/24 0415  NA 140  --  142  K 5.1  --  4.5  CL 103  --  109  CO2 23  --  20*  GLUCOSE 156*  --  121*  BUN 69*  --  62*  CREATININE 2.35*  --  2.06*  CALCIUM 9.1  --  7.6*  MG  --  3.0*  --    GFR: CrCl cannot be calculated (Unknown ideal weight.). Liver Function Tests: Recent Labs  Lab 03/31/24 0415  AST 36  ALT 23  ALKPHOS 84  BILITOT <0.2  PROT 6.2*  ALBUMIN 3.1*   No results for input(s): LIPASE, AMYLASE in the last 168 hours. No results for input(s): AMMONIA in the last 168 hours. Coagulation Profile: No results for input(s): INR, PROTIME in the last 168 hours. Cardiac Enzymes: No results for input(s): CKTOTAL, CKMB, CKMBINDEX, TROPONINI in the  last 168 hours. BNP (last 3 results) Recent Labs    03/30/24 1906  PROBNP >35,000.0*   HbA1C: Recent Labs    03/30/24 2330  HGBA1C 7.0*   CBG: Recent Labs  Lab 03/30/24 2328 03/31/24 0750  GLUCAP 127* 98   Lipid Profile: No results for input(s): CHOL, HDL, LDLCALC, TRIG, CHOLHDL, LDLDIRECT in the last 72 hours. Thyroid  Function Tests: No results for input(s): TSH, T4TOTAL, FREET4, T3FREE, THYROIDAB in the last 72 hours. Anemia Panel: Recent Labs    03/30/24 2330  VITAMINB12 1,667*  FOLATE 16.9  FERRITIN 195  TIBC 237*  IRON 49  RETICCTPCT 1.3   Sepsis Labs: Recent Labs  Lab 03/31/24 0415  PROCALCITON 0.96    Recent Results (from the past 240 hours)  Resp panel by RT-PCR (RSV, Flu A&B, Covid) Anterior Nasal Swab     Status: Abnormal   Collection Time: 03/30/24  7:54 PM   Specimen: Anterior Nasal Swab  Result Value Ref Range Status   SARS Coronavirus 2 by RT PCR NEGATIVE  NEGATIVE Final    Comment: (NOTE) SARS-CoV-2 target nucleic acids are NOT DETECTED.  The SARS-CoV-2 RNA is generally detectable in upper respiratory specimens during the acute phase of infection. The lowest concentration of SARS-CoV-2 viral copies this assay can detect is 138 copies/mL. A negative result does not preclude SARS-Cov-2 infection and should not be used as the sole basis for treatment or other patient management decisions. A negative result may occur with  improper specimen collection/handling, submission of specimen other than nasopharyngeal swab, presence of viral mutation(s) within the areas targeted by this assay, and inadequate number of viral copies(<138 copies/mL). A negative result must be combined with clinical observations, patient history, and epidemiological information. The expected result is Negative.  Fact Sheet for Patients:  bloggercourse.com  Fact Sheet for Healthcare Providers:  seriousbroker.it  This test is no t yet approved or cleared by the [place]  FDA and  has been authorized for detection and/or diagnosis of SARS-CoV-2 by FDA under an Emergency Use Authorization (EUA). This EUA will remain  in effect (meaning this test can be used) for the duration of the COVID-19 declaration under Section 564(b)(1) of the Act, 21 U.S.C.section 360bbb-3(b)(1), unless the authorization is terminated  or revoked sooner.       Influenza A by PCR NEGATIVE NEGATIVE Final   Influenza B by PCR NEGATIVE NEGATIVE Final    Comment: (NOTE) The Xpert Xpress SARS-CoV-2/FLU/RSV plus assay is intended as an aid in the diagnosis of influenza from Nasopharyngeal swab specimens and should not be used as a sole basis for treatment. Nasal washings and aspirates are unacceptable for Xpert Xpress SARS-CoV-2/FLU/RSV testing.  Fact Sheet for Patients: bloggercourse.com  Fact Sheet for Healthcare  Providers: seriousbroker.it  This test is not yet approved or cleared by the [place]  FDA and has been authorized for detection and/or diagnosis of SARS-CoV-2 by FDA under an Emergency Use Authorization (EUA). This EUA will remain in effect (meaning this test can be used) for the duration of the COVID-19 declaration under Section 564(b)(1) of the Act, 21 U.S.C. section 360bbb-3(b)(1), unless the authorization is terminated or revoked.     Resp Syncytial Virus by PCR POSITIVE (A) NEGATIVE Final    Comment: (NOTE) Fact Sheet for Patients: bloggercourse.com  Fact Sheet for Healthcare Providers: seriousbroker.it  This test is not yet approved or cleared by the [place]  FDA and has been authorized for detection and/or diagnosis of SARS-CoV-2 by FDA under an Emergency Use  Authorization (EUA). This EUA will remain in effect (meaning this test can be used) for the duration of the COVID-19 declaration under Section 564(b)(1) of the Act, 21 U.S.C. section 360bbb-3(b)(1), unless the authorization is terminated or revoked.  Performed at [facility], [address], [place], [place] [address]          Radiology Studies: US  RENAL Result Date: 03/30/2024 EXAM: RETROPERITONEAL ULTRASOUND OF THE KIDNEYS 03/30/2024 11:08:25 PM TECHNIQUE: Real-time ultrasonography of the retroperitoneum, specifically the kidneys and urinary bladder, was performed. COMPARISON: None available. CLINICAL HISTORY: Acute renal injury. FINDINGS: RIGHT KIDNEY: Right kidney measures 7.4 x 2.9 x 3.0 cm. The calculated volume is 34 ml. Diffuse increased echogenicity is noted, consistent with chronic medical renal disease. No hydronephrosis. No calculus. No mass. LEFT KIDNEY: Left kidney measures 9.8 x 5.2 x 4.9 cm. The calculated volume is 129 ml. Increased echogenicity is noted. No hydronephrosis. No calculus. No mass.  BLADDER: The bladder is well distended. IMPRESSION: 1. Bilateral increased renal echogenicity, consistent with chronic medical renal disease. 2. Small right kidney. Electronically signed by: Oneil Devonshire MD 03/30/2024 11:17 PM EST RP Workstation: [id number]   DG Chest 2 View Result Date: 03/30/2024 EXAM: 2 VIEW(S) XRAY OF THE CHEST 03/30/2024 09:04:00 PM COMPARISON: 01/01/2018 CLINICAL HISTORY: Shortness of breath. FINDINGS: LUNGS AND PLEURA: Mild pulmonary edema. Patchy bilateral airspace opacities. Trace bilateral pleural effusions. No pneumothorax. HEART AND MEDIASTINUM: Cardiomegaly. Aortic atherosclerotic calcification. Hiatal hernia. BONES AND SOFT TISSUES: No acute osseous abnormality. IMPRESSION: 1. Mild pulmonary edema and patchy bilateral airspace opacities.Follow-up PA and lateral chest X-ray is recommended in 3-4 weeks following therapy to ensure resolution and exclude underlying malignancy. 2. Trace bilateral pleural effusions. 3. Cardiomegaly. Electronically signed by: Morgane Naveau MD 03/30/2024 09:09 PM EST RP Workstation: [id number]        Scheduled Meds:  furosemide   40 mg Intravenous BID   insulin  aspart  0-5 Units Subcutaneous QHS   insulin  aspart  0-9 Units Subcutaneous TID WC   Continuous Infusions:   LOS: 1 day    Time spent: 52 minutes spent on 03/31/2024 caring for this patient face-to-face including chart review, ordering labs/tests, documenting, discussion with nursing staff, consultants, updating family and interview/physical exam    Camellia PARAS Pantelis Elgersma, DO [facility] Available via Epic secure chat 7am-7pm After these hours, please refer to coverage provider listed on [email or web address] 03/31/2024, 9:37 AM   "

## 2024-03-31 NOTE — Progress Notes (Signed)
 Initial Nutrition Assessment  DOCUMENTATION CODES:   Not applicable  INTERVENTION:  - Heart Healthy/Carb Modified diet per MD.   - Recommend liberalizing if oral intake is poor.  - Automatic trays.  - Add feeding assistance with meals. - Ensure Plus High Protein po BID, each supplement provides 350 kcal and 20 grams of protein. - Magic cup TID with meals, each supplement provides 290 kcal and 9 grams of protein - Encourage intake at all meals.  - Multivitamin with minerals daily.  - Monitor weight trends.  - No weight taken on admission and last weight from 2020, please obtain a new weight.   NUTRITION DIAGNOSIS:   Increased nutrient needs related to acute illness as evidenced by estimated needs.  GOAL:   Patient will meet greater than or equal to 90% of their needs  MONITOR:   PO intake, Supplement acceptance, Weight trends  REASON FOR ASSESSMENT:   Consult Assessment of nutrition requirement/status  ASSESSMENT:   89 y.o. female with PMH significant for DM2, HLD , hard of hearing who presented with shortness of breath, cough, generalized weakness. Admitted for acute respiratory failure and acute on chronic CHF exacerbation with RSV infection.   Patient in bed at time of visit, no family at bedside. Lunch tray untouched on bedside table.   Patient did not respond to any questions, unable to obtain nutrition history at this time. Allowed some parts of NFPE but moved away and wanted to be covered under the blankets so exam limited.   Per EMR, no weight history since 2020. Ordered a new weight this morning but no weight yet taken.  Patient on a heart healthy and carb modified diet. Will add feeding assistance with meals to encourage intake and ONS to support intake.     Medications reviewed and include: -  Labs reviewed:  Creatinine 2.06 HA1C 7.0 (as of 1/28) Blood Glucose 91-127 x24 hours  NUTRITION - FOCUSED PHYSICAL EXAM: *Patient refused majority of physical  exam  Flowsheet Row Most Recent Value  Orbital Region Unable to assess  Upper Arm Region Unable to assess  Thoracic and Lumbar Region Unable to assess  Buccal Region No depletion  Temple Region Mild depletion  Clavicle Bone Region Severe depletion  Clavicle and Acromion Bone Region Moderate depletion  Scapular Bone Region Unable to assess  Dorsal Hand Unable to assess  Patellar Region Mild depletion  Anterior Thigh Region Mild depletion  Posterior Calf Region Mild depletion  Edema (RD Assessment) None  Hair Reviewed  Eyes Reviewed  Mouth Unable to assess  Skin Reviewed  Nails Unable to assess    Diet Order:   Diet Order             Diet heart healthy/carb modified Room service appropriate? No; Fluid consistency: Thin; Fluid restriction: 1200 mL Fluid  Diet effective now                   EDUCATION NEEDS:  Not appropriate for education at this time  Skin:  Skin Assessment: Skin Integrity Issues: Skin Integrity Issues:: Stage I Stage I: Buttocks  Last BM:  1/29  Height:  Ht Readings from Last 1 Encounters:  04/17/15 5' 1 (1.549 m)   Weight:  Wt Readings from Last 1 Encounters:  08/26/18 71.8 kg   BMI:  There is no height or weight on file to calculate BMI.  Estimated Nutritional Needs:  *Unable to assess nutrition needs without an updated height and weight Kcal:    Protein:  Fluid:       Trude Ned RD, LDN Contact via Secure Chat.

## 2024-04-01 ENCOUNTER — Other Ambulatory Visit (HOSPITAL_COMMUNITY): Payer: Self-pay

## 2024-04-01 ENCOUNTER — Telehealth (HOSPITAL_COMMUNITY): Payer: Self-pay

## 2024-04-01 DIAGNOSIS — I5033 Acute on chronic diastolic (congestive) heart failure: Secondary | ICD-10-CM | POA: Diagnosis not present

## 2024-04-01 LAB — BASIC METABOLIC PANEL WITH GFR
Anion gap: 10 (ref 5–15)
BUN: 71 mg/dL — ABNORMAL HIGH (ref 8–23)
CO2: 27 mmol/L (ref 22–32)
Calcium: 9.1 mg/dL (ref 8.9–10.3)
Chloride: 103 mmol/L (ref 98–111)
Creatinine, Ser: 1.93 mg/dL — ABNORMAL HIGH (ref 0.44–1.00)
GFR, Estimated: 24 mL/min — ABNORMAL LOW
Glucose, Bld: 147 mg/dL — ABNORMAL HIGH (ref 70–99)
Potassium: 5.1 mmol/L (ref 3.5–5.1)
Sodium: 140 mmol/L (ref 135–145)

## 2024-04-01 LAB — GLUCOSE, CAPILLARY
Glucose-Capillary: 114 mg/dL — ABNORMAL HIGH (ref 70–99)
Glucose-Capillary: 128 mg/dL — ABNORMAL HIGH (ref 70–99)
Glucose-Capillary: 139 mg/dL — ABNORMAL HIGH (ref 70–99)
Glucose-Capillary: 151 mg/dL — ABNORMAL HIGH (ref 70–99)
Glucose-Capillary: 217 mg/dL — ABNORMAL HIGH (ref 70–99)

## 2024-04-01 LAB — MAGNESIUM: Magnesium: 3.3 mg/dL — ABNORMAL HIGH (ref 1.7–2.4)

## 2024-04-01 LAB — PHOSPHORUS: Phosphorus: 5.4 mg/dL — ABNORMAL HIGH (ref 2.5–4.6)

## 2024-04-01 MED ORDER — SODIUM BICARBONATE 8.4 % IV SOLN
INTRAVENOUS | Status: DC
  Filled 2024-04-01: qty 1000

## 2024-04-01 MED ORDER — MIDAZOLAM HCL 2 MG/2ML IJ SOLN
INTRAMUSCULAR | Status: AC
  Filled 2024-04-01: qty 2

## 2024-04-01 MED ORDER — SUCCINYLCHOLINE CHLORIDE 200 MG/10ML IV SOSY
PREFILLED_SYRINGE | INTRAVENOUS | Status: AC
  Filled 2024-04-01: qty 10

## 2024-04-01 MED ORDER — FENTANYL CITRATE (PF) 50 MCG/ML IJ SOSY
PREFILLED_SYRINGE | INTRAMUSCULAR | Status: AC
  Filled 2024-04-01: qty 2

## 2024-04-01 MED ORDER — ROCURONIUM BROMIDE 10 MG/ML (PF) SYRINGE
PREFILLED_SYRINGE | INTRAVENOUS | Status: AC
  Filled 2024-04-01: qty 10

## 2024-04-01 MED ORDER — ETOMIDATE 2 MG/ML IV SOLN
INTRAVENOUS | Status: AC
  Filled 2024-04-01: qty 20

## 2024-04-01 MED ORDER — EPINEPHRINE HCL 5 MG/250ML IV SOLN IN NS
0.5000 ug/min | INTRAVENOUS | Status: DC
  Filled 2024-04-01: qty 250

## 2024-04-01 NOTE — Progress Notes (Incomplete)
" ° ° ° °  Patient Name: Andrea Sweeney           DOB: 08/31/1928  MRN: [id number]      Admission Date: 03/30/2024  Attending Provider: [place], Eric J, DO  Primary Diagnosis: Acute exacerbation of CHF (congestive heart failure) (HCC)   Level of care: Progressive   CODE BLUE  Per RN, telemetry alerted for bradycardic and patient was quickly assessed-- found to be pulseless at bedside. No other acute changes noted prior to this event.  ACLS started immediately. Epi administered, pt remained pulseless. EDP at bedside.   Spoke with emergency contact number, [name] (sister) regarding current situation. She understands the critical situation and stated that [name] would not want to be on life support or machine. Given this conversation, [name] agrees to stop resuscitative measures. All questions were answered.   Time of death 05-05-2341.    Cashtyn Pouliot, DNP, ACNPC- AG [facility] [place]    "

## 2024-04-01 NOTE — Evaluation (Signed)
 Occupational Therapy Evaluation/Discharge Patient Details Name: Andrea Sweeney MRN: [id number] DOB: 02-07-1929 Today's Date: 04/28/24   History of Present Illness   89 y.o. female with past medical history significant for DM2, HLD who presented to [facility] ED on 03/30/2024 from home via EMS with shortness of breath, cough, generalized weakness and admitted 03/30/24 for acute hypoxemic respiratory failure  (Etiology likely secondary to CHF exacerbation coupled with RSV viral infection).     Clinical Impressions Pt admitted with the above concerns. Pt currently with functional limitations due to the deficits listed below (see OT Problem List). Pt presents with profound hearing impairment that significantly limits effective two-way communication required for skilled OT intervention. Despite attempted communication strategies, patient is unable to follow verbal and non-verbal instructions necessary for therapeutic participation. As a result, patient is not appropriate for skilled OT services in the acute care setting at this time. No further skilled OT treatment is recommended. Discharge planning to continue per medical team, with family support as indicated.       If plan is discharge home, recommend the following:   A little help with walking and/or transfers;A little help with bathing/dressing/bathroom     Functional Status Assessment   Patient has not had a recent decline in their functional status     Equipment Recommendations   None recommended by OT      Precautions/Restrictions   Precautions Precautions: Fall;Other (comment) Recall of Precautions/Restrictions: Impaired Precaution/Restrictions Comments: hearing impaired Restrictions Weight Bearing Restrictions Per Provider Order: No     Mobility Bed Mobility Overal bed mobility: Needs Assistance Bed Mobility: Supine to Sit     Supine to sit: Contact guard, HOB elevated     General bed mobility  comments: Required physical assist to initiate task d/t HOH.    Transfers Overall transfer level: Needs assistance Equipment used: 2 person hand held assist Transfers: Sit to/from Stand, Bed to chair/wheelchair/BSC Sit to Stand: Min assist, From elevated surface, +2 physical assistance Two person assist provided d/t to hearing impairment and need to provide physical assist to initiate tasks.     Step pivot transfers: Contact guard assist, +2 physical assistance            Balance Overall balance assessment: Mild deficits observed, not formally tested         ADL either performed or assessed with clinical judgement   ADL       Grooming: Set up;Sitting   Upper Body Bathing: Set up;Sitting   Lower Body Bathing: Minimal assistance;Sitting/lateral leans;Sit to/from stand   Upper Body Dressing : Minimal assistance;Sitting   Lower Body Dressing: Minimal assistance;Sitting/lateral leans;Sit to/from stand   Toilet Transfer: Minimal assistance;+2 for physical assistance;Stand-pivot;BSC/3in1   Toileting- Clothing Manipulation and Hygiene: Minimal assistance;Sitting/lateral lean               Vision Baseline Vision/History: 1 Wears glasses Patient Visual Report: Other (comment) (unable to report) Vision Assessment?:  (unable to assess)     Perception Perception: Not tested       Praxis Praxis: Not tested       Pertinent Vitals/Pain Pain Assessment Pain Assessment: Faces Faces Pain Scale: No hurt     Extremity/Trunk Assessment Upper Extremity Assessment Upper Extremity Assessment: Overall WFL for tasks assessed   Lower Extremity Assessment Lower Extremity Assessment: Generalized weakness   Cervical / Trunk Assessment Cervical / Trunk Assessment: Kyphotic   Communication Communication Communication: Impaired Factors Affecting Communication: Hearing impaired   Cognition Arousal: Alert Behavior During Therapy: [facility]  for tasks  assessed/performed Cognition: Difficult to assess Difficult to assess due to: Hard of hearing/deaf  Following commands: Impaired Following commands impaired: Follows one step commands inconsistently (Due to [facility])     Cueing  General Comments   Cueing Techniques: Gestural cues (worked somewhat)  2L O2 [place] with SpO2 mid 90's. Increased time required for SpO2 reading. On RA, SpO2 dropped to high 80's. 2L O2 was placed at end of session.           Home Living Family/patient expects to be discharged to:: Private residence Living Arrangements: Other relatives (Sister)    Home Equipment: [place] - single point   Additional Comments: Unable to communicate with patient due to severe HOH. Only information is obtained from chart.      Prior Functioning/Environment Prior Level of Function : Patient poor historian/Family not available  Mobility Comments: Uses SPC      OT Problem List: Impaired balance (sitting and/or standing);Decreased strength        OT Goals(Current goals can be found in the care plan section)   Acute Rehab OT Goals OT Goal Formulation: All assessment and education complete, DC therapy   OT Frequency:   1x visit     Co-evaluation PT/OT/SLP Co-Evaluation/Treatment: Yes Reason for Co-Treatment: To address functional/ADL transfers PT goals addressed during session: Mobility/safety with mobility OT goals addressed during session: ADL's and self-care;Strengthening/ROM      AM-PAC OT 6 Clicks Daily Activity     Outcome Measure Help from another person eating meals?: A Little Help from another person taking care of personal grooming?: A Little Help from another person toileting, which includes using toliet, bedpan, or urinal?: A Little Help from another person bathing (including washing, rinsing, drying)?: A Little Help from another person to put on and taking off regular upper body clothing?: A Little Help from another person to put on and taking off regular  lower body clothing?: A Little 6 Click Score: 18   End of Session Equipment Utilized During Treatment: Oxygen  Activity Tolerance: Patient tolerated treatment well Patient left: in chair;with call bell/phone within reach;with chair alarm set  OT Visit Diagnosis: Muscle weakness (generalized) (M62.81)                Time: 1000-1034 OT Time Calculation (min): 34 min Charges:  OT General Charges $OT Visit: 1 Visit OT Evaluation $OT Eval Low Complexity: 1 Low  [employer], OTR/L,CBIS  Supplemental OT - [facility] and [facility] Secure Chat Preferred    Brihany Butch, Leita BIRCH Apr 28, 2024, 1:09 PM

## 2024-04-01 NOTE — Evaluation (Addendum)
 Physical Therapy Evaluation Patient Details Name: Andrea Sweeney MRN: [id number] DOB: [birth year] Today's Date: April 04, 2024  History of Present Illness  89 y.o. female with past medical history significant for DM2, HLD who presented to [facility] ED on 03/30/2024 from home via EMS with shortness of breath, cough, generalized weakness and admitted 03/30/24 for acute hypoxemic respiratory failure  (Etiology likely secondary to CHF exacerbation coupled with RSV viral infection).  Clinical Impression  Pt admitted with above diagnosis.  Pt currently with functional limitations due to the deficits listed below (see PT Problem List). Pt will benefit from acute skilled PT to increase their independence and safety with mobility to allow discharge.  Pt extremely HOH however able to follow gestures and tactile cues for OOB to recliner.  SpO2 dropped to 86% on room air so 2L O2 [place] reapplied once in recliner.  Pt from home with sister and uses [facility] for ambulation per chart review.  Pt likely able to return to home upon d/c however may need a little assist initially.         If plan is discharge home, recommend the following: A little help with walking and/or transfers;A little help with bathing/dressing/bathroom;Assistance with cooking/housework;Assist for transportation;Help with stairs or ramp for entrance   Can travel by private vehicle        Equipment Recommendations Rolling walker (2 wheels)  Recommendations for Other Services       Functional Status Assessment Patient has had a recent decline in their functional status and demonstrates the ability to make significant improvements in function in a reasonable and predictable amount of time.     Precautions / Restrictions Precautions Precautions: Fall;Other (comment) Recall of Precautions/Restrictions: Impaired Precaution/Restrictions Comments: hearing impaired, monitor sats      Mobility  Bed Mobility Overal bed mobility: Needs  Assistance Bed Mobility: Supine to Sit     Supine to sit: Contact guard     General bed mobility comments: Required physical assist to initiate task d/t HOH.    Transfers Overall transfer level: Needs assistance Equipment used: 2 person hand held assist Transfers: Sit to/from Stand, Bed to chair/wheelchair/BSC Sit to Stand: Min assist, From elevated surface, +2 safety/equipment           General transfer comment: gestural and tactile cues for transfer, SPO2 dropped to 86% on room air so reapplied 2L O2 [place] and improved to 95%    Ambulation/Gait                  Stairs            Wheelchair Mobility     Tilt Bed    Modified Rankin (Stroke Patients Only)       Balance Overall balance assessment: Mild deficits observed, not formally tested                                           Pertinent Vitals/Pain Pain Assessment Pain Assessment: Faces Faces Pain Scale: No hurt Pain Intervention(s): Monitored during session, Repositioned    Home Living Family/patient expects to be discharged to:: Private residence Living Arrangements: Other relatives (Sister)               Home Equipment: [place] - single point Additional Comments: Unable to communicate with patient due to severe HOH. Only information is obtained from chart.    Prior Function Prior Level of Function :  Patient poor historian/Family not available             Mobility Comments: Uses [facility]       Extremity/Trunk Assessment   Upper Extremity Assessment Upper Extremity Assessment: Overall WFL for tasks assessed    Lower Extremity Assessment Lower Extremity Assessment: Generalized weakness    Cervical / Trunk Assessment Cervical / Trunk Assessment: Kyphotic  Communication   Communication Communication: Impaired Factors Affecting Communication: Hearing impaired    Cognition Arousal: Alert Behavior During Therapy: WFL for tasks assessed/performed                              Following commands: Impaired Following commands impaired: Follows one step commands inconsistently (Due to [facility])     Cueing Cueing Techniques: Gestural cues (worked somewhat)     General Comments General comments (skin integrity, edema, etc.): 2L O2 [place] with SpO2 mid 90's. Increased time required for SpO2 reading. On RA, SpO2 dropped to high 80's. 2L O2 was placed at end of session.    Exercises     Assessment/Plan    PT Assessment Patient needs continued PT services  PT Problem List Decreased balance;Decreased activity tolerance;Decreased strength;Decreased mobility;Decreased knowledge of use of DME;Cardiopulmonary status limiting activity       PT Treatment Interventions DME instruction;Gait training;Therapeutic exercise;Balance training;Functional mobility training;Therapeutic activities;Patient/family education    PT Goals (Current goals can be found in the Care Plan section)  Acute Rehab PT Goals PT Goal Formulation: Patient unable to participate in goal setting Time For Goal Achievement: 04/15/24 Potential to Achieve Goals: Good    Frequency Min 2X/week     Co-evaluation PT/OT/SLP Co-Evaluation/Treatment: Yes Reason for Co-Treatment: To address functional/ADL transfers PT goals addressed during session: Mobility/safety with mobility OT goals addressed during session: ADL's and self-care;Strengthening/ROM       AM-PAC PT 6 Clicks Mobility  Outcome Measure Help needed turning from your back to your side while in a flat bed without using bedrails?: A Little Help needed moving from lying on your back to sitting on the side of a flat bed without using bedrails?: A Little Help needed moving to and from a bed to a chair (including a wheelchair)?: A Little Help needed standing up from a chair using your arms (e.g., wheelchair or bedside chair)?: A Lot Help needed to walk in hospital room?: A Lot Help needed climbing 3-5 steps with a railing? :  A Lot 6 Click Score: 15    End of Session Equipment Utilized During Treatment: Gait belt;Oxygen Activity Tolerance: Patient tolerated treatment well Patient left: in chair;with call bell/phone within reach;with chair alarm set   PT Visit Diagnosis: Difficulty in walking, not elsewhere classified (R26.2);Muscle weakness (generalized) (M62.81)    Time: 8974-8965 PT Time Calculation (min) (ACUTE ONLY): 9 min   Charges:   PT Evaluation $PT Eval Low Complexity: 1 Low   PT General Charges $$ ACUTE PT VISIT: 1 Visit        Tari PT, DPT Physical Therapist Acute Rehabilitation Services Office: [phone]   Kati L Payson April 12, 2024, 1:13 PM

## 2024-04-01 NOTE — Progress Notes (Signed)
 " PROGRESS NOTE    Andrea Sweeney  FMW:[id number] DOB: [birth year] DOA: 03/30/2024 PCP: Rexanne Ingle, MD    Brief Narrative:   Andrea Sweeney is a 89 y.o. female with past medical history significant for DM2, HLD who presented to [facility] ED on 03/30/2024 from home via EMS with shortness of breath, cough, generalized weakness.  Patient extremely hard of hearing, unable to answer questions and no family present at bedside.  On EMS arrival, patient was noted to be 89% on room air and placed on 2 L nasal cannula.  And transported to the ED for further evaluation and management.  In the ED, temperature 90.1 F, HR 77, RR 16, BP 144/62, SpO2 96% on 2 L nasal cannula.  WBC 9.7, hemoglobin 10.1, platelet count 137.  Sodium 140, potassium 5.1, chloride 103, CO2 23, glucose 156, BUN 69, creatinine 2.35.  BNP greater than 35,000, high-sensitivity troponin 143 followed by 141.  RSV PCR positive.  COVID/influenza A/B PCR negative.  2 view chest x-ray with mild pulm edema, patchy bilateral airspace opacities, trace bilateral pleural effusions, cardiomegaly.  Renal ultrasound with bilateral increased renal echogenicity consistent with chronic medical renal disease, small right kidney.  EKG with NSR, LAFB, RBBB, LVH, T wave inversions lateral leads, QTc 518 with no previous EKG for comparison.  Patient was given furosemide  40 mg IV x 1, DuoNeb in the ED.  TRH consulted for admission.  Assessment & Plan:   Acute hypoxemic respiratory failure Etiology likely secondary to CHF exacerbation coupled with RSV viral infection. -- Continue treatment as below -- Continue supplemental oxygen, maintain SpO2 greater than 92%; wean off O2  Acute on chronic diastolic CHF exacerbation Patient presenting with cough, shortness of breath.  proBNP elevated greater than 35,000 with chest x-ray with mild pulmonary edema, cardiomegaly.  TTE: With LVEF 55-60%, grade 2 diastolic dysfunction, no LV RWMA, severely elevated  PASP, right atrial mildly enlarged, mild/moderate TR, no AR stenosis, IVC dilated in size. -- Furosemide  40 mg IV every 12 hours -- wt 57>51kg -- strict I's and O's, daily weights -- BMP daily  RSV viral infection RSV PCR positive.  Patient was found to be hypoxic on EMS arrival, complicated by CHF exacerbation as well. -- Droplet/contact isolation precautions -- Supportive care, continue oxygen and attempt to wean off; maintain SpO2 greater than 92%  Elevated troponin High-sensitivity troponin elevated 143 followed by 141; flat.  Etiology likely secondary to type II demand ischemia in the setting of CHF exacerbation as above.  Denies chest pain. -- Continue monitor on telemetry  AKI versus CKD BUN 69, creatinine 2.35, GFR 19.  Baseline renal function unknown as there are no previous labs for comparison.  Unclear if AKI, possible cardiorenal in etiology in the setting of decompensated CHF versus possible CKD.  Renal ultrasound with bilateral increased renal echogenicity consistent with chronic medical renal disease, small right kidney. -- Cr 2.35>2.06>1.93 -- Continue IV diuresis as above -- Avoid nephrotoxins, renal dose all medications -- Strict I's and O's, monitor urine output -- BMP in a.m.   QT prolongation/abnormal EKG EKG showing sinus rhythm, LAFB, RBBB, LVH, T wave inversions in lateral leads, and QTc 518.  No previous EKG for comparison.  Patient is resting comfortably and not endorsing chest pain.   -- Avoid QT prolonging drugs    Macrocytic anemia Hemoglobin 10.1 with MCV 102.1.  Anemia panel with iron 49, TIBC 237, ferritin 195, folate 16.9, vitamin B12 1667.  No previous labs for comparison.   --  Hgb 10.1>9.2, stable   Mild thrombocytopenia -- Plt 137>135; stable   Type 2 diabetes Hemoglobin A1c 7.0.  On Actos outpatient -- sensitive sliding scale insulin   -- CBGs before every meal/at bedtime  -- Likely will discontinue Actos on discharge; consider Jardiance  versus diet control on discharge   DVT prophylaxis: SCDs Start: 03/30/24 2232    Code Status: Full Code Family Communication: No family present at bedside this morning  Disposition Plan:  Level of care: Progressive Status is: Inpatient Remains inpatient appropriate because: IV diuresis, pending TTE    Consultants:  None  Procedures:  TTE: Pending  Antimicrobials:  None   Subjective: The patient seen examined bedside, lying in bed.  RN present at bedside.  Appears comfortable, attempting to wean off of oxygen.  No family present.  Renal function continues to slowly improve.  Remains on IV diuresis.  Denies pain, no shortness of breath.  Unable obtain further ROS given her difficulty hearing   Objective: Vitals:   03/31/24 2305 Apr 24, 2024 0303 2024/04/24 0331 Apr 24, 2024 0659  BP: (!) 119/52 (!) 118/46  (!) 106/45  Pulse: 77 76  75  Resp: 20 12  14   Temp: 97.8 F (36.6 C) 98.5 F (36.9 C)  98.1 F (36.7 C)  TempSrc: Oral Oral  Oral  SpO2: 94% 100%  100%  Weight:   52.1 kg     Intake/Output Summary (Last 24 hours) at Apr 24, 2024 1046 Last data filed at Apr 24, 2024 0135 Gross per 24 hour  Intake 360 ml  Output 800 ml  Net -440 ml   Filed Weights   03/31/24 1923 2024-04-24 0331  Weight: 57 kg 52.1 kg    Examination:  Physical Exam: GEN: NAD, very hard of hearing, alert, elderly/chronically ill in appearance HEENT: NCAT, PERRL, EOMI, sclera clear, poor dentition PULM: Coarse breath sounds bilaterally, normal respiratory effort, on 1 L nasal cannula with SpO2 100% at rest CV: RRR w/o M/G/R GI: abd soft, NTND, + BS MSK: no peripheral edema, moves all extremities independently NEURO: No focal neurological deficit, but difficult exam given hard of hearing PSYCH: normal mood/affect Integumentary: No concerning rash/lesions/wounds nonexposed skin cirrhosis    Data Reviewed: I have personally reviewed following labs and imaging studies  CBC: Recent Labs  Lab  03/30/24 1906 03/31/24 0415  WBC 9.7 9.9  NEUTROABS 7.9*  --   HGB 10.1* 9.2*  HCT 33.9* 31.3*  MCV 102.1* 103.6*  PLT 137* 135*   Basic Metabolic Panel: Recent Labs  Lab 03/30/24 1906 03/30/24 2330 03/31/24 0415 24-Apr-2024 0414  NA 140  --  142 140  K 5.1  --  4.5 5.1  CL 103  --  109 103  CO2 23  --  20* 27  GLUCOSE 156*  --  121* 147*  BUN 69*  --  62* 71*  CREATININE 2.35*  --  2.06* 1.93*  CALCIUM 9.1  --  7.6* 9.1  MG  --  3.0*  --  3.3*  PHOS  --   --   --  5.4*   GFR: CrCl cannot be calculated (Unknown ideal weight.). Liver Function Tests: Recent Labs  Lab 03/31/24 0415  AST 36  ALT 23  ALKPHOS 84  BILITOT <0.2  PROT 6.2*  ALBUMIN 3.1*   No results for input(s): LIPASE, AMYLASE in the last 168 hours. No results for input(s): AMMONIA in the last 168 hours. Coagulation Profile: No results for input(s): INR, PROTIME in the last 168 hours. Cardiac Enzymes: No results for  input(s): CKTOTAL, CKMB, CKMBINDEX, TROPONINI in the last 168 hours. BNP (last 3 results) Recent Labs    03/30/24 1906  PROBNP >35,000.0*   HbA1C: Recent Labs    03/30/24 2330  HGBA1C 7.0*   CBG: Recent Labs  Lab 03/31/24 0750 03/31/24 1201 03/31/24 1624 03/31/24 2019 April 15, 2024 0722  GLUCAP 98 91 79 151* 139*   Lipid Profile: No results for input(s): CHOL, HDL, LDLCALC, TRIG, CHOLHDL, LDLDIRECT in the last 72 hours. Thyroid  Function Tests: No results for input(s): TSH, T4TOTAL, FREET4, T3FREE, THYROIDAB in the last 72 hours. Anemia Panel: Recent Labs    03/30/24 2330  VITAMINB12 1,667*  FOLATE 16.9  FERRITIN 195  TIBC 237*  IRON 49  RETICCTPCT 1.3   Sepsis Labs: Recent Labs  Lab 03/31/24 0415  PROCALCITON 0.96    Recent Results (from the past 240 hours)  Resp panel by RT-PCR (RSV, Flu A&B, Covid) Anterior Nasal Swab     Status: Abnormal   Collection Time: 03/30/24  7:54 PM   Specimen: Anterior Nasal Swab  Result  Value Ref Range Status   SARS Coronavirus 2 by RT PCR NEGATIVE NEGATIVE Final    Comment: (NOTE) SARS-CoV-2 target nucleic acids are NOT DETECTED.  The SARS-CoV-2 RNA is generally detectable in upper respiratory specimens during the acute phase of infection. The lowest concentration of SARS-CoV-2 viral copies this assay can detect is 138 copies/mL. A negative result does not preclude SARS-Cov-2 infection and should not be used as the sole basis for treatment or other patient management decisions. A negative result may occur with  improper specimen collection/handling, submission of specimen other than nasopharyngeal swab, presence of viral mutation(s) within the areas targeted by this assay, and inadequate number of viral copies(<138 copies/mL). A negative result must be combined with clinical observations, patient history, and epidemiological information. The expected result is Negative.  Fact Sheet for Patients:  bloggercourse.com  Fact Sheet for Healthcare Providers:  seriousbroker.it  This test is no t yet approved or cleared by the [place]  FDA and  has been authorized for detection and/or diagnosis of SARS-CoV-2 by FDA under an Emergency Use Authorization (EUA). This EUA will remain  in effect (meaning this test can be used) for the duration of the COVID-19 declaration under Section 564(b)(1) of the Act, 21 U.S.C.section 360bbb-3(b)(1), unless the authorization is terminated  or revoked sooner.       Influenza A by PCR NEGATIVE NEGATIVE Final   Influenza B by PCR NEGATIVE NEGATIVE Final    Comment: (NOTE) The Xpert Xpress SARS-CoV-2/FLU/RSV plus assay is intended as an aid in the diagnosis of influenza from Nasopharyngeal swab specimens and should not be used as a sole basis for treatment. Nasal washings and aspirates are unacceptable for Xpert Xpress SARS-CoV-2/FLU/RSV testing.  Fact Sheet for  Patients: bloggercourse.com  Fact Sheet for Healthcare Providers: seriousbroker.it  This test is not yet approved or cleared by the [place]  FDA and has been authorized for detection and/or diagnosis of SARS-CoV-2 by FDA under an Emergency Use Authorization (EUA). This EUA will remain in effect (meaning this test can be used) for the duration of the COVID-19 declaration under Section 564(b)(1) of the Act, 21 U.S.C. section 360bbb-3(b)(1), unless the authorization is terminated or revoked.     Resp Syncytial Virus by PCR POSITIVE (A) NEGATIVE Final    Comment: (NOTE) Fact Sheet for Patients: bloggercourse.com  Fact Sheet for Healthcare Providers: seriousbroker.it  This test is not yet approved or cleared by the [place]  FDA  and has been authorized for detection and/or diagnosis of SARS-CoV-2 by FDA under an Emergency Use Authorization (EUA). This EUA will remain in effect (meaning this test can be used) for the duration of the COVID-19 declaration under Section 564(b)(1) of the Act, 21 U.S.C. section 360bbb-3(b)(1), unless the authorization is terminated or revoked.  Performed at [facility], [address], [place], [place] [address]   Culture, blood (routine x 2) Call MD if unable to obtain prior to antibiotics being given     Status: None (Preliminary result)   Collection Time: 03/30/24 11:25 PM   Specimen: BLOOD  Result Value Ref Range Status   Specimen Description   Final    BLOOD BLOOD RIGHT WRIST Performed at [facility], [address], [place], [place] [address]    Special Requests   Final    BOTTLES DRAWN AEROBIC ONLY Blood Culture results may not be optimal due to an inadequate volume of blood received in culture bottles Performed at [facility], [address] [place], [place] [address]     Culture   Final    NO GROWTH 1 DAY Performed at [facility], [address] [place], [place] [address]    Report Status PENDING  Incomplete  Culture, blood (routine x 2) Call MD if unable to obtain prior to antibiotics being given     Status: None (Preliminary result)   Collection Time: 03/30/24 11:30 PM   Specimen: BLOOD  Result Value Ref Range Status   Specimen Description   Final    BLOOD LEFT ANTECUBITAL Performed at [facility], [address] [place], [place] [address]    Special Requests   Final    BOTTLES DRAWN AEROBIC AND ANAEROBIC Blood Culture adequate volume Performed at [facility], [address] [place], [place] [address]    Culture   Final    NO GROWTH 1 DAY Performed at [facility], [address] [place], [place] [address]    Report Status PENDING  Incomplete         Radiology Studies: ECHOCARDIOGRAM COMPLETE Result Date: 03/31/2024    ECHOCARDIOGRAM REPORT   Patient Name:   CHRISTINA GINTZ Date of Exam: 03/31/2024 Medical Rec #:  [id number]       Height:       61.0 in Accession #:    [id number]      Weight:       158.4 lb Date of Birth:  [birth year]       BSA:          1.711 m Patient Age:    89 years        BP:           125/69 mmHg Patient Gender: F               HR:           69 bpm. Exam Location:  Inpatient Procedure: 2D Echo, Cardiac Doppler and Color Doppler (Both Spectral and Color            Flow Doppler were utilized during procedure). Indications:    CHF  History:        Patient has no prior history of Echocardiogram examinations.                 CHF; Risk Factors:Diabetes.  Sonographer:    Philomena Daring Referring Phys: [id number] VASUNDHRA RATHORE IMPRESSIONS  1. Left ventricular ejection  fraction, by estimation, is 55 to 60%. The left ventricle has normal function. The left ventricle has no regional wall motion abnormalities. Left ventricular diastolic parameters are consistent with Grade II diastolic  dysfunction (pseudonormalization). Elevated left atrial pressure.  2. Right ventricular systolic function is normal. The right ventricular size is normal. There is severely elevated pulmonary artery systolic pressure. The estimated right ventricular systolic pressure is 62.1 mmHg.  3. Right atrial size was mildly dilated.  4. The mitral valve is normal in structure. Trivial mitral valve regurgitation. No evidence of mitral stenosis.  5. Tricuspid valve regurgitation is mild to moderate.  6. The aortic valve is tricuspid. There is mild calcification of the aortic valve. Aortic valve regurgitation is mild. Aortic valve sclerosis/calcification is present, without any evidence of aortic stenosis. Aortic regurgitation PHT measures 563 msec.  7. The inferior vena cava is dilated in size with <50% respiratory variability, suggesting right atrial pressure of 15 mmHg.  8. There is no evidence of pericardial effusion. FINDINGS  Left Ventricle: Left ventricular ejection fraction, by estimation, is 55 to 60%. The left ventricle has normal function. The left ventricle has no regional wall motion abnormalities. The left ventricular internal cavity size was normal in size. There is  no left ventricular hypertrophy. Left ventricular diastolic parameters are consistent with Grade II diastolic dysfunction (pseudonormalization). Elevated left atrial pressure. Right Ventricle: The right ventricular size is normal. No increase in right ventricular wall thickness. Right ventricular systolic function is normal. There is severely elevated pulmonary artery systolic pressure. The tricuspid regurgitant velocity is 3.43 m/s, and with an assumed right atrial pressure of 15 mmHg, the estimated right ventricular systolic pressure is 62.1 mmHg. Left Atrium: Left atrial size was normal in size. Right Atrium: Right atrial size was mildly dilated. Pericardium: There is no evidence of pericardial effusion. Mitral Valve: The mitral valve is normal in  structure. Trivial mitral valve regurgitation. No evidence of mitral valve stenosis. Tricuspid Valve: The tricuspid valve is normal in structure. Tricuspid valve regurgitation is mild to moderate. No evidence of tricuspid stenosis. Aortic Valve: The aortic valve is tricuspid. There is mild calcification of the aortic valve. Aortic valve regurgitation is mild. Aortic regurgitation PHT measures 563 msec. Aortic valve sclerosis/calcification is present, without any evidence of aortic stenosis. Pulmonic Valve: The pulmonic valve was normal in structure. Pulmonic valve regurgitation is trivial. No evidence of pulmonic stenosis. Aorta: The aortic root and ascending aorta are structurally normal, with no evidence of dilitation. Venous: The inferior vena cava is dilated in size with less than 50% respiratory variability, suggesting right atrial pressure of 15 mmHg. IAS/Shunts: No atrial level shunt detected by color flow Doppler.  LEFT VENTRICLE PLAX 2D LVIDd:         3.40 cm   Diastology LVIDs:         2.40 cm   LV e' medial:    3.48 cm/s LV PW:         1.00 cm   LV E/e' medial:  15.1 LV IVS:        1.30 cm   LV e' lateral:   4.46 cm/s LVOT diam:     1.90 cm   LV E/e' lateral: 11.8 LV SV:         56 LV SV Index:   33 LVOT Area:     2.84 cm  RIGHT VENTRICLE             IVC RV S prime:     12.00  cm/s  IVC diam: 2.10 cm TAPSE (M-mode): 1.9 cm LEFT ATRIUM             Index        RIGHT ATRIUM           Index LA diam:        3.50 cm 2.05 cm/m   RA Area:     18.60 cm LA Vol (A2C):   39.6 ml 23.15 ml/m  RA Volume:   53.90 ml  31.51 ml/m LA Vol (A4C):   43.1 ml 25.20 ml/m LA Biplane Vol: 41.7 ml 24.38 ml/m  AORTIC VALVE LVOT Vmax:   108.00 cm/s LVOT Vmean:  68.400 cm/s LVOT VTI:    0.198 m AI PHT:      563 msec  AORTA Ao Root diam: 3.20 cm Ao Asc diam:  3.30 cm MITRAL VALVE               TRICUSPID VALVE MV Area (PHT): 3.27 cm    TR Peak grad:   47.1 mmHg MV Decel Time: 232 msec    TR Vmax:        343.00 cm/s MV E velocity:  52.70 cm/s MV A velocity: 93.00 cm/s  SHUNTS MV E/A ratio:  0.57        Systemic VTI:  0.20 m                            Systemic Diam: 1.90 cm Wilbert Bihari MD Electronically signed by Wilbert Bihari MD Signature Date/Time: 03/31/2024/7:52:00 PM    Final    US  RENAL Result Date: 03/30/2024 EXAM: RETROPERITONEAL ULTRASOUND OF THE KIDNEYS 03/30/2024 11:08:25 PM TECHNIQUE: Real-time ultrasonography of the retroperitoneum, specifically the kidneys and urinary bladder, was performed. COMPARISON: None available. CLINICAL HISTORY: Acute renal injury. FINDINGS: RIGHT KIDNEY: Right kidney measures 7.4 x 2.9 x 3.0 cm. The calculated volume is 34 ml. Diffuse increased echogenicity is noted, consistent with chronic medical renal disease. No hydronephrosis. No calculus. No mass. LEFT KIDNEY: Left kidney measures 9.8 x 5.2 x 4.9 cm. The calculated volume is 129 ml. Increased echogenicity is noted. No hydronephrosis. No calculus. No mass. BLADDER: The bladder is well distended. IMPRESSION: 1. Bilateral increased renal echogenicity, consistent with chronic medical renal disease. 2. Small right kidney. Electronically signed by: Oneil Devonshire MD 03/30/2024 11:17 PM EST RP Workstation: [id number]   DG Chest 2 View Result Date: 03/30/2024 EXAM: 2 VIEW(S) XRAY OF THE CHEST 03/30/2024 09:04:00 PM COMPARISON: 01/01/2018 CLINICAL HISTORY: Shortness of breath. FINDINGS: LUNGS AND PLEURA: Mild pulmonary edema. Patchy bilateral airspace opacities. Trace bilateral pleural effusions. No pneumothorax. HEART AND MEDIASTINUM: Cardiomegaly. Aortic atherosclerotic calcification. Hiatal hernia. BONES AND SOFT TISSUES: No acute osseous abnormality. IMPRESSION: 1. Mild pulmonary edema and patchy bilateral airspace opacities.Follow-up PA and lateral chest X-ray is recommended in 3-4 weeks following therapy to ensure resolution and exclude underlying malignancy. 2. Trace bilateral pleural effusions. 3. Cardiomegaly. Electronically signed by: Morgane  Naveau MD 03/30/2024 09:09 PM EST RP Workstation: [id number]        Scheduled Meds:  feeding supplement  237 mL Oral BID BM   furosemide   40 mg Intravenous BID   insulin  aspart  0-5 Units Subcutaneous QHS   insulin  aspart  0-9 Units Subcutaneous TID WC   multivitamin with minerals  1 tablet Oral Daily   Continuous Infusions:   LOS: 2 days    Time spent: 52 minutes spent on 2024-04-18 caring for this patient  face-to-face including chart review, ordering labs/tests, documenting, discussion with nursing staff, consultants, updating family and interview/physical exam    Camellia PARAS Jkai Arwood, DO [facility] Available via Epic secure chat 7am-7pm After these hours, please refer to coverage provider listed on [email or web address] 04-23-24, 10:46 AM   "

## 2024-04-01 NOTE — ED Provider Notes (Signed)
 " Physical Exam  BP 99/67 (BP Location: Left Arm)   Pulse 82   Temp 98.6 F (37 C) (Oral)   Resp 14   Wt 52.1 kg   SpO2 90%   BMI 21.70 kg/m   Physical Exam Vitals and nursing note reviewed. Exam conducted with a chaperone present.     Procedures  .Critical Care  Performed by: Nettie Earing, MD Authorized by: Nettie Earing, MD   Critical care provider statement:    Critical care time (minutes):  30   Critical care end time:  2024/04/08 11:55 PM   Critical care was necessary to treat or prevent imminent or life-threatening deterioration of the following conditions:  Cardiac failure and circulatory failure   Critical care was time spent personally by me on the following activities:  Discussions with primary provider   ED Course / MDM    Medical Decision Making Amount and/or Complexity of Data Reviewed Labs: ordered. Radiology: ordered.  Risk Prescription drug management. Decision regarding hospitalization.      Department of Emergency Medicine CODE BLUE CONSULTATION    Code Blue CONSULT NOTE  Chief Complaint: Cardiac arrest/unresponsive   Level V Caveat: Unresponsive  History of present illness: I was contacted by the hospital for a CODE BLUE cardiac arrest upstairs and presented to the patient's bedside.  Admitted for CHF and RSV  ROS: Unable to obtain, Level V caveat  Scheduled Meds:  feeding supplement  237 mL Oral BID BM   furosemide   40 mg Intravenous BID   insulin  aspart  0-5 Units Subcutaneous QHS   insulin  aspart  0-9 Units Subcutaneous TID WC   multivitamin with minerals  1 tablet Oral Daily   Continuous Infusions:  [START ON 04/02/2024] epinephrine      [START ON 04/02/2024] sodium bicarbonate  150 mEq in dextrose  5 % 1,150 mL infusion     PRN Meds:.acetaminophen  **OR** acetaminophen , dextromethorphan-guaiFENesin  Past Medical History:  Diagnosis Date   Arthritis    Cataract    Diabetes mellitus without complication (HCC)    History  reviewed. No pertinent surgical history. Social History   Socioeconomic History   Marital status: Widowed    Spouse name: Not on file   Number of children: Not on file   Years of education: Not on file   Highest education level: Not on file  Occupational History   Not on file  Tobacco Use   Smoking status: Never   Smokeless tobacco: Never  Substance and Sexual Activity   Alcohol use: No    Alcohol/week: 0.0 standard drinks of alcohol   Drug use: No   Sexual activity: Not on file  Other Topics Concern   Not on file  Social History Narrative   Not on file   Social Drivers of Health   Tobacco Use: Low Risk (03/31/2024)   Patient History    Smoking Tobacco Use: Never    Smokeless Tobacco Use: Never    Passive Exposure: Not on file  Financial Resource Strain: Not on file  Food Insecurity: Patient Unable To Answer (Apr 08, 2024)   Epic    Worried About Programme Researcher, Broadcasting/film/video in the Last Year: Patient unable to answer    Ran Out of Food in the Last Year: Patient unable to answer  Transportation Needs: Unknown (2024-04-08)   Epic    Lack of Transportation (Medical): Patient unable to answer    Lack of Transportation (Non-Medical): Not on file  Physical Activity: Not on file  Stress: Not on file  Social  Connections: Unknown (30-Apr-2024)   Social Connection and Isolation Panel    Frequency of Communication with Friends and Family: Patient unable to answer    Frequency of Social Gatherings with Friends and Family: Not on file    Attends Religious Services: Not on file    Active Member of Clubs or Organizations: Not on file    Attends Banker Meetings: Not on file    Marital Status: Not on file  Intimate Partner Violence: Patient Unable To Answer (2024-04-30)   Epic    Fear of Current or Ex-Partner: Patient unable to answer    Emotionally Abused: Patient unable to answer    Physically Abused: Patient unable to answer    Sexually Abused: Patient unable to answer   Depression (PHQ2-9): Not on file  Alcohol Screen: Not on file  Housing: Unknown (04-30-2024)   Epic    Unable to Pay for Housing in the Last Year: Patient unable to answer    Number of Times Moved in the Last Year: Not on file    Homeless in the Last Year: Not on file  Utilities: Patient Unable To Answer (04/30/24)   Epic    Threatened with loss of utilities: Patient unable to answer  Health Literacy: Not on file   Allergies[1]  Last set of Vital Signs (not current) Vitals:   04-30-24 1521 Apr 30, 2024 2040  BP: (!) 110/51 99/67  Pulse: 73 82  Resp: 15 14  Temp: 98 F (36.7 C) 98.6 F (37 C)  SpO2: 96% 90%      Physical Exam  Gen: unresponsive Cardiovascular: pulseless  Resp: apneic. Breath sounds equal bilaterally with bagging  Abd: nondistended  Neuro: GCS 3, unresponsive to pain  HEENT: No blood in posterior pharynx, gag reflex absent  Neck: No crepitus  Musculoskeletal: No deformity  Skin: warm  Procedures  INTUBATION Performed by: Ninetta Adelstein K Desirre Eickhoff-Rasch Required items: required blood products, implants, devices, and special equipment available Patient identity confirmed: provided demographic data and hospital-assigned identification number Time out: Immediately prior to procedure a time out was called to verify the correct patient, procedure, equipment, support staff and site/side marked as required. Indications: cardiac arrest  Preoxygenation: BVM Post-procedure assessment: chest rise and ETCO2 monitor Breath sounds: equal and absent over the epigastrium Tube secured by Respiratory Therapy Patient tolerated the procedure well with no immediate complications.  CRITICAL CARE Performed by: Chayanne Filippi K Orva Gwaltney-Rasch Total critical care time: 64 Critical care time was exclusive of separately billable procedures and treating other patients. Critical care was necessary to treat or prevent imminent or life-threatening deterioration. Critical care was time spent  personally by me on the following activities: development of treatment plan with patient and/or surrogate as well as nursing, discussions with consultants, evaluation of patient's response to treatment, examination of patient, obtaining history from patient or surrogate, ordering and performing treatments and interventions, ordering and review of laboratory studies, ordering and review of radiographic studies, pulse oximetry and re-evaluation of patient's condition.  Cardiopulmonary Resuscitation (CPR) Procedure Note  Directed/Performed by: Quaniyah Bugh K Kyara Boxer-Rasch I personally directed ancillary staff and/or performed CPR in an effort to regain return of spontaneous circulation and to maintain cardiac, neuro and systemic perfusion.    Medical Decision making  PEA arrest to asystole  Assessment and Plan  Continuous high quality CPR provided with epinephrine  and sodium bicarbonate .  Despite all efforts we were unable to regain pulses.          [1] No Known Allergies  Phelicia Dantes, MD Apr 29, 2024 2359  "

## 2024-04-01 NOTE — Telephone Encounter (Signed)
 Pharmacy Patient Advocate Encounter  Insurance verification completed.    The patient is insured through [facility]. Patient has Medicare and is not eligible for a copay card, but may be able to apply for patient assistance or Medicare RX Payment Plan (Patient Must reach out to their plan, if eligible for payment plan), if available.    Ran test claim for Jardiance 10mg  tablet and the current 30 day co-pay is $204.57 due to deductible.   This test claim was processed through [facility]- copay amounts may vary at other pharmacies due to pharmacy/plan contracts, or as the patient moves through the different stages of their insurance plan.

## 2024-04-02 MED FILL — Medication: Qty: 1 | Status: AC

## 2024-04-02 NOTE — Progress Notes (Signed)
 Telemetry notified this RN of decreasing heart rate. This RN immediately went to patient's bedside. Patient was found unresponsive and pulseless. Code blue initiated. CPR started immediately. Code team arrived at bedside and assumed care. Advanced cardiac life support provided per protocol. Despite resuscitative efforts, patient did not regain spontaneous circulation. Time of death documented by provider.   Emergency contact was attempted via telephone with no response; voicemail was unavailable to leave a message. Alternate emergency contact Andrea Sweeney patient's sister was contacted and notified of patient's condition and events. Sister stated she is unable to come to the hospital at this time but will send her daughter to the hospital.

## 2024-04-03 NOTE — Death Summary Note (Signed)
 "  DEATH SUMMARY   Patient Details  Name: Andrea Sweeney MRN: [id number] DOB: 07-Jul-1928 ERE:Enopuz, Tanda, MD Admission/Discharge Information   Admit Date:  April 09, 2024  Date of Death: Date of Death: Apr 11, 2024  Time of Death: Time of Death: Apr 21, 2341  Length of Stay: 3   Principle Cause of death: Acute on chronic diastolic congestive heart failure, respiratory syncytial virus pneumonia  Hospital Diagnoses: Principal Problem:   Acute exacerbation of CHF (congestive heart failure) (HCC) Active Problems:   DM type 2 (diabetes mellitus, type 2) (HCC)   RSV (respiratory syncytial virus pneumonia)   Acute hypoxemic respiratory failure (HCC)   AKI (acute kidney injury)  Brief Narrative:    Andrea Sweeney is a 89 y.o. female with past medical history significant for DM2, HLD who presented to [facility] ED on Apr 09, 2024 from home via EMS with shortness of breath, cough, generalized weakness.  Patient extremely hard of hearing, unable to answer questions and no family present at bedside.  On EMS arrival, patient was noted to be 89% on room air and placed on 2 L nasal cannula.  And transported to the ED for further evaluation and management.   In the ED, temperature 90.1 F, HR 77, RR 16, BP 144/62, SpO2 96% on 2 L nasal cannula.  WBC 9.7, hemoglobin 10.1, platelet count 137.  Sodium 140, potassium 5.1, chloride 103, CO2 23, glucose 156, BUN 69, creatinine 2.35.  BNP greater than 35,000, high-sensitivity troponin 143 followed by 141.  RSV PCR positive.  COVID/influenza A/B PCR negative.  2 view chest x-ray with mild pulm edema, patchy bilateral airspace opacities, trace bilateral pleural effusions, cardiomegaly.  Renal ultrasound with bilateral increased renal echogenicity consistent with chronic medical renal disease, small right kidney.  EKG with NSR, LAFB, RBBB, LVH, T wave inversions lateral leads, QTc 518 with no previous EKG for comparison.  Patient was given furosemide  40 mg IV x 1,  DuoNeb in the ED.  TRH consulted for admission.  Hospital course:  Acute hypoxemic respiratory failure Acute on chronic diastolic congestive heart failure exacerbation RSV viral pneumonia Elevated troponin secondary to type II demand ischemia in the setting of CHF exacerbation Acute renal failure versus CKD Patient presenting with shortness of breath, generalized weakness.  proBNP elevated greater than 35,000 with chest x-ray with mild pulmonary edema, cardiomegaly.  TTE: With LVEF 55-60%, grade 2 diastolic dysfunction, no LV RWMA, severely elevated PASP, right atrial mildly enlarged, mild/moderate TR, no AR stenosis, IVC dilated in size.  RSV PCR positive.  Patient was started on aggressive IV diuresis with furosemide .  Patient's respiratory status was slowly improving but unfortunately patient became bradycardic and unresponsive on 04-11-2024.  CODE BLUE was initiated and patient was found to be pulseless at bedside.  ACLS was initiated and patient was intubated by the ED physician.  Patient's sister, [name] was notified regarding critical situation and that she reported Andrea Sweeney would not want to be on life support or a machine, given this conversation Andrea Sweeney agreed to stop resuscitative measures and patient passed away at 04/21/2341.       Procedures: TTE, intubation  Consultations: None  The results of significant diagnostics from this hospitalization (including imaging, microbiology, ancillary and laboratory) are listed below for reference.   Significant Diagnostic Studies: ECHOCARDIOGRAM COMPLETE Result Date: 03/31/2024    ECHOCARDIOGRAM REPORT   Patient Name:   Andrea Sweeney Date of Exam: 03/31/2024 Medical Rec #:  [id number]       Height:  61.0 in Accession #:    [id number]      Weight:       158.4 lb Date of Birth:  [birth year]       BSA:          1.711 m Patient Age:    89 years        BP:           125/69 mmHg Patient Gender: F               HR:           69 bpm. Exam  Location:  Inpatient Procedure: 2D Echo, Cardiac Doppler and Color Doppler (Both Spectral and Color            Flow Doppler were utilized during procedure). Indications:    CHF  History:        Patient has no prior history of Echocardiogram examinations.                 CHF; Risk Factors:Diabetes.  Sonographer:    Philomena Daring Referring Phys: [id number] VASUNDHRA RATHORE IMPRESSIONS  1. Left ventricular ejection fraction, by estimation, is 55 to 60%. The left ventricle has normal function. The left ventricle has no regional wall motion abnormalities. Left ventricular diastolic parameters are consistent with Grade II diastolic dysfunction (pseudonormalization). Elevated left atrial pressure.  2. Right ventricular systolic function is normal. The right ventricular size is normal. There is severely elevated pulmonary artery systolic pressure. The estimated right ventricular systolic pressure is 62.1 mmHg.  3. Right atrial size was mildly dilated.  4. The mitral valve is normal in structure. Trivial mitral valve regurgitation. No evidence of mitral stenosis.  5. Tricuspid valve regurgitation is mild to moderate.  6. The aortic valve is tricuspid. There is mild calcification of the aortic valve. Aortic valve regurgitation is mild. Aortic valve sclerosis/calcification is present, without any evidence of aortic stenosis. Aortic regurgitation PHT measures 563 msec.  7. The inferior vena cava is dilated in size with <50% respiratory variability, suggesting right atrial pressure of 15 mmHg.  8. There is no evidence of pericardial effusion. FINDINGS  Left Ventricle: Left ventricular ejection fraction, by estimation, is 55 to 60%. The left ventricle has normal function. The left ventricle has no regional wall motion abnormalities. The left ventricular internal cavity size was normal in size. There is  no left ventricular hypertrophy. Left ventricular diastolic parameters are consistent with Grade II diastolic dysfunction  (pseudonormalization). Elevated left atrial pressure. Right Ventricle: The right ventricular size is normal. No increase in right ventricular wall thickness. Right ventricular systolic function is normal. There is severely elevated pulmonary artery systolic pressure. The tricuspid regurgitant velocity is 3.43 m/s, and with an assumed right atrial pressure of 15 mmHg, the estimated right ventricular systolic pressure is 62.1 mmHg. Left Atrium: Left atrial size was normal in size. Right Atrium: Right atrial size was mildly dilated. Pericardium: There is no evidence of pericardial effusion. Mitral Valve: The mitral valve is normal in structure. Trivial mitral valve regurgitation. No evidence of mitral valve stenosis. Tricuspid Valve: The tricuspid valve is normal in structure. Tricuspid valve regurgitation is mild to moderate. No evidence of tricuspid stenosis. Aortic Valve: The aortic valve is tricuspid. There is mild calcification of the aortic valve. Aortic valve regurgitation is mild. Aortic regurgitation PHT measures 563 msec. Aortic valve sclerosis/calcification is present, without any evidence of aortic stenosis. Pulmonic Valve: The pulmonic valve was normal in structure. Pulmonic valve regurgitation is trivial.  No evidence of pulmonic stenosis. Aorta: The aortic root and ascending aorta are structurally normal, with no evidence of dilitation. Venous: The inferior vena cava is dilated in size with less than 50% respiratory variability, suggesting right atrial pressure of 15 mmHg. IAS/Shunts: No atrial level shunt detected by color flow Doppler.  LEFT VENTRICLE PLAX 2D LVIDd:         3.40 cm   Diastology LVIDs:         2.40 cm   LV e' medial:    3.48 cm/s LV PW:         1.00 cm   LV E/e' medial:  15.1 LV IVS:        1.30 cm   LV e' lateral:   4.46 cm/s LVOT diam:     1.90 cm   LV E/e' lateral: 11.8 LV SV:         56 LV SV Index:   33 LVOT Area:     2.84 cm  RIGHT VENTRICLE             IVC RV S prime:     12.00  cm/s  IVC diam: 2.10 cm TAPSE (M-mode): 1.9 cm LEFT ATRIUM             Index        RIGHT ATRIUM           Index LA diam:        3.50 cm 2.05 cm/m   RA Area:     18.60 cm LA Vol (A2C):   39.6 ml 23.15 ml/m  RA Volume:   53.90 ml  31.51 ml/m LA Vol (A4C):   43.1 ml 25.20 ml/m LA Biplane Vol: 41.7 ml 24.38 ml/m  AORTIC VALVE LVOT Vmax:   108.00 cm/s LVOT Vmean:  68.400 cm/s LVOT VTI:    0.198 m AI PHT:      563 msec  AORTA Ao Root diam: 3.20 cm Ao Asc diam:  3.30 cm MITRAL VALVE               TRICUSPID VALVE MV Area (PHT): 3.27 cm    TR Peak grad:   47.1 mmHg MV Decel Time: 232 msec    TR Vmax:        343.00 cm/s MV E velocity: 52.70 cm/s MV A velocity: 93.00 cm/s  SHUNTS MV E/A ratio:  0.57        Systemic VTI:  0.20 m                            Systemic Diam: 1.90 cm Wilbert Bihari MD Electronically signed by Wilbert Bihari MD Signature Date/Time: 03/31/2024/7:52:00 PM    Final    US  RENAL Result Date: 03/30/2024 EXAM: RETROPERITONEAL ULTRASOUND OF THE KIDNEYS 03/30/2024 11:08:25 PM TECHNIQUE: Real-time ultrasonography of the retroperitoneum, specifically the kidneys and urinary bladder, was performed. COMPARISON: None available. CLINICAL HISTORY: Acute renal injury. FINDINGS: RIGHT KIDNEY: Right kidney measures 7.4 x 2.9 x 3.0 cm. The calculated volume is 34 ml. Diffuse increased echogenicity is noted, consistent with chronic medical renal disease. No hydronephrosis. No calculus. No mass. LEFT KIDNEY: Left kidney measures 9.8 x 5.2 x 4.9 cm. The calculated volume is 129 ml. Increased echogenicity is noted. No hydronephrosis. No calculus. No mass. BLADDER: The bladder is well distended. IMPRESSION: 1. Bilateral increased renal echogenicity, consistent with chronic medical renal disease. 2. Small right kidney. Electronically signed by: Oneil Devonshire MD 03/30/2024  11:17 PM EST RP Workstation: [id number]   DG Chest 2 View Result Date: 03/30/2024 EXAM: 2 VIEW(S) XRAY OF THE CHEST 03/30/2024 09:04:00 PM COMPARISON:  01/01/2018 CLINICAL HISTORY: Shortness of breath. FINDINGS: LUNGS AND PLEURA: Mild pulmonary edema. Patchy bilateral airspace opacities. Trace bilateral pleural effusions. No pneumothorax. HEART AND MEDIASTINUM: Cardiomegaly. Aortic atherosclerotic calcification. Hiatal hernia. BONES AND SOFT TISSUES: No acute osseous abnormality. IMPRESSION: 1. Mild pulmonary edema and patchy bilateral airspace opacities.Follow-up PA and lateral chest X-ray is recommended in 3-4 weeks following therapy to ensure resolution and exclude underlying malignancy. 2. Trace bilateral pleural effusions. 3. Cardiomegaly. Electronically signed by: Morgane Naveau MD 03/30/2024 09:09 PM EST RP Workstation: [id number]    Microbiology: Recent Results (from the past 240 hours)  Resp panel by RT-PCR (RSV, Flu A&B, Covid) Anterior Nasal Swab     Status: Abnormal   Collection Time: 03/30/24  7:54 PM   Specimen: Anterior Nasal Swab  Result Value Ref Range Status   SARS Coronavirus 2 by RT PCR NEGATIVE NEGATIVE Final    Comment: (NOTE) SARS-CoV-2 target nucleic acids are NOT DETECTED.  The SARS-CoV-2 RNA is generally detectable in upper respiratory specimens during the acute phase of infection. The lowest concentration of SARS-CoV-2 viral copies this assay can detect is 138 copies/mL. A negative result does not preclude SARS-Cov-2 infection and should not be used as the sole basis for treatment or other patient management decisions. A negative result may occur with  improper specimen collection/handling, submission of specimen other than nasopharyngeal swab, presence of viral mutation(s) within the areas targeted by this assay, and inadequate number of viral copies(<138 copies/mL). A negative result must be combined with clinical observations, patient history, and epidemiological information. The expected result is Negative.  Fact Sheet for Patients:  bloggercourse.com  Fact Sheet for Healthcare  Providers:  seriousbroker.it  This test is no t yet approved or cleared by the [place]  FDA and  has been authorized for detection and/or diagnosis of SARS-CoV-2 by FDA under an Emergency Use Authorization (EUA). This EUA will remain  in effect (meaning this test can be used) for the duration of the COVID-19 declaration under Section 564(b)(1) of the Act, 21 U.S.C.section 360bbb-3(b)(1), unless the authorization is terminated  or revoked sooner.       Influenza A by PCR NEGATIVE NEGATIVE Final   Influenza B by PCR NEGATIVE NEGATIVE Final    Comment: (NOTE) The Xpert Xpress SARS-CoV-2/FLU/RSV plus assay is intended as an aid in the diagnosis of influenza from Nasopharyngeal swab specimens and should not be used as a sole basis for treatment. Nasal washings and aspirates are unacceptable for Xpert Xpress SARS-CoV-2/FLU/RSV testing.  Fact Sheet for Patients: bloggercourse.com  Fact Sheet for Healthcare Providers: seriousbroker.it  This test is not yet approved or cleared by the [place]  FDA and has been authorized for detection and/or diagnosis of SARS-CoV-2 by FDA under an Emergency Use Authorization (EUA). This EUA will remain in effect (meaning this test can be used) for the duration of the COVID-19 declaration under Section 564(b)(1) of the Act, 21 U.S.C. section 360bbb-3(b)(1), unless the authorization is terminated or revoked.     Resp Syncytial Virus by PCR POSITIVE (A) NEGATIVE Final    Comment: (NOTE) Fact Sheet for Patients: bloggercourse.com  Fact Sheet for Healthcare Providers: seriousbroker.it  This test is not yet approved or cleared by the [place]  FDA and has been authorized for detection and/or diagnosis of SARS-CoV-2 by FDA under an Emergency Use Authorization (EUA). This EUA will  remain in effect (meaning this test  can be used) for the duration of the COVID-19 declaration under Section 564(b)(1) of the Act, 21 U.S.C. section 360bbb-3(b)(1), unless the authorization is terminated or revoked.  Performed at [facility], [address] [place], [place] [address]   Culture, blood (routine x 2) Call MD if unable to obtain prior to antibiotics being given     Status: None (Preliminary result)   Collection Time: 03/30/24 11:25 PM   Specimen: BLOOD  Result Value Ref Range Status   Specimen Description   Final    BLOOD BLOOD RIGHT WRIST Performed at [facility], [address] [place], [place] [address]    Special Requests   Final    BOTTLES DRAWN AEROBIC ONLY Blood Culture results may not be optimal due to an inadequate volume of blood received in culture bottles Performed at [facility], [address] [place], [place] [address]    Culture   Final    NO GROWTH 2 DAYS Performed at [facility], [address], [place], [place] [address]    Report Status PENDING  Incomplete  Culture, blood (routine x 2) Call MD if unable to obtain prior to antibiotics being given     Status: None (Preliminary result)   Collection Time: 03/30/24 11:30 PM   Specimen: BLOOD  Result Value Ref Range Status   Specimen Description   Final    BLOOD LEFT ANTECUBITAL Performed at [facility], [address], [place], [place] [address]    Special Requests   Final    BOTTLES DRAWN AEROBIC AND ANAEROBIC Blood Culture adequate volume Performed at [facility], [address] [place], [place] [address]    Culture   Final    NO GROWTH 2 DAYS Performed at [facility], [address], [place], [place] [address]    Report Status PENDING  Incomplete    Time spent: 32 minutes  Signed: Camellia PARAS Lekendrick Alpern, DO 04/02/2024   "

## 2024-04-03 DEATH — deceased

## 2024-04-05 LAB — CULTURE, BLOOD (ROUTINE X 2)
Culture: NO GROWTH
Culture: NO GROWTH
Special Requests: ADEQUATE
